# Patient Record
Sex: Female | Born: 1990 | Race: White | Hispanic: No | Marital: Single | Smoking: Never smoker
Health system: Southern US, Community
[De-identification: ages and names within clinical notes are randomized; demographics above are authoritative.]

## PROBLEM LIST (undated history)

## (undated) ENCOUNTER — Emergency Department (HOSPITAL_COMMUNITY): Payer: BC Managed Care – PPO

## (undated) DIAGNOSIS — F411 Generalized anxiety disorder: Secondary | ICD-10-CM

## (undated) HISTORY — DX: Generalized anxiety disorder: F41.1

## (undated) HISTORY — PX: NO PAST SURGERIES: SHX2092

---
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
## 2009-08-22 ENCOUNTER — Encounter: Admission: RE | Admit: 2009-08-22 | Discharge: 2009-08-22 | Payer: Self-pay | Admitting: Internal Medicine

## 2009-08-23 ENCOUNTER — Emergency Department (HOSPITAL_COMMUNITY): Admission: EM | Admit: 2009-08-23 | Discharge: 2009-08-23 | Payer: Self-pay | Admitting: Emergency Medicine

## 2009-11-17 ENCOUNTER — Emergency Department (HOSPITAL_COMMUNITY)
Admission: EM | Admit: 2009-11-17 | Discharge: 2009-11-17 | Disposition: A | Discharge status: Other Acute Inpatient Hospital | Payer: Self-pay | Source: Home / Self Care | Admitting: Emergency Medicine

## 2010-07-31 ENCOUNTER — Inpatient Hospital Stay (HOSPITAL_COMMUNITY)
Admission: RE | Admit: 2010-07-31 | Discharge: 2010-08-02 | Payer: Self-pay | Source: Home / Self Care | Attending: Psychiatry | Admitting: Psychiatry
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
## 2010-08-05 LAB — CBC
HCT: 39.2 % (ref 36.0–46.0)
Hemoglobin: 13 g/dL (ref 12.0–15.0)
MCH: 28.6 pg (ref 26.0–34.0)
MCHC: 33.2 g/dL (ref 30.0–36.0)
MCV: 86.3 fL (ref 78.0–100.0)
RDW: 13.8 % (ref 11.5–15.5)

## 2010-08-05 LAB — URINE DRUGS OF ABUSE SCREEN W ALC, ROUTINE (REF LAB)
Benzodiazepines.: NEGATIVE
Ethyl Alcohol: <10 mg/dL (ref <10)
Methadone: NEGATIVE
Opiate Screen, Urine: NEGATIVE
Phencyclidine (PCP): NEGATIVE
Propoxyphene: NEGATIVE

## 2010-08-05 LAB — COMPREHENSIVE METABOLIC PANEL
ALT: 15 U/L (ref 0–35)
AST: 17 U/L (ref 0–37)
CO2: 27 mEq/L (ref 19–32)
Creatinine, Ser: 0.93 mg/dL (ref 0.4–1.2)
Glucose, Bld: 98 mg/dL (ref 70–99)
Potassium: 3.6 mEq/L (ref 3.5–5.1)
Total Protein: 7.6 g/dL (ref 6.0–8.3)

## 2010-08-05 LAB — URINE MICROSCOPIC-ADD ON

## 2010-08-05 LAB — URINALYSIS, ROUTINE W REFLEX MICROSCOPIC
Leukocytes, UA: NEGATIVE
Nitrite: NEGATIVE
Urine Glucose, Fasting: NEGATIVE mg/dL
Urobilinogen, UA: 0.2 mg/dL (ref 0.0–1.0)
pH: 6 (ref 5.0–8.0)

## 2010-10-03 LAB — DIFFERENTIAL
Eosinophils Relative: 0 % (ref 0–5)
Lymphocytes Relative: 15 % (ref 12–46)
Lymphs Abs: 1.7 10*3/uL (ref 0.7–4.0)
Monocytes Absolute: 0.5 10*3/uL (ref 0.1–1.0)
Monocytes Relative: 5 % (ref 3–12)
Neutrophils Relative %: 80 % — ABNORMAL HIGH (ref 43–77)

## 2010-10-03 LAB — COMPREHENSIVE METABOLIC PANEL
ALT: 21 U/L (ref 0–35)
Albumin: 4.2 g/dL (ref 3.5–5.2)
Alkaline Phosphatase: 86 U/L (ref 39–117)
BUN: 8 mg/dL (ref 6–23)
GFR calc Af Amer: >60 mL/min (ref >60)
GFR calc non Af Amer: >60 mL/min (ref >60)
Glucose, Bld: 109 mg/dL — ABNORMAL HIGH (ref 70–99)
Potassium: 3.5 mEq/L (ref 3.5–5.1)
Total Bilirubin: 0.4 mg/dL (ref 0.3–1.2)

## 2010-10-03 LAB — URINALYSIS, ROUTINE W REFLEX MICROSCOPIC
Bilirubin Urine: NEGATIVE
Hgb urine dipstick: NEGATIVE
Ketones, ur: NEGATIVE mg/dL
Protein, ur: NEGATIVE mg/dL
Specific Gravity, Urine: 1.022 (ref 1.005–1.030)
Urobilinogen, UA: 1 mg/dL (ref 0.0–1.0)
pH: 7 (ref 5.0–8.0)

## 2010-10-03 LAB — CBC
HCT: 40.1 % (ref 36.0–46.0)
Hemoglobin: 13.4 g/dL (ref 12.0–15.0)
MCHC: 33.5 g/dL (ref 30.0–36.0)
Platelets: 311 10*3/uL (ref 150–400)
RBC: 4.74 MIL/uL (ref 3.87–5.11)

## 2010-10-03 LAB — PREGNANCY, URINE: Preg Test, Ur: NEGATIVE

## 2013-03-24 ENCOUNTER — Emergency Department (HOSPITAL_COMMUNITY)
Admission: EM | Admit: 2013-03-24 | Discharge: 2013-03-25 | Disposition: A | Discharge status: Home / Self Care | Payer: BC Managed Care – PPO | Attending: Emergency Medicine | Admitting: Emergency Medicine

## 2013-03-24 ENCOUNTER — Encounter (HOSPITAL_COMMUNITY): Payer: Self-pay | Admitting: Emergency Medicine

## 2013-03-24 DIAGNOSIS — S139XXA Sprain of joints and ligaments of unspecified parts of neck, initial encounter: Secondary | ICD-10-CM | POA: Insufficient documentation

## 2013-03-24 DIAGNOSIS — T148XXA Other injury of unspecified body region, initial encounter: Secondary | ICD-10-CM

## 2013-03-24 DIAGNOSIS — Y939 Activity, unspecified: Secondary | ICD-10-CM | POA: Insufficient documentation

## 2013-03-24 DIAGNOSIS — X58XXXA Exposure to other specified factors, initial encounter: Secondary | ICD-10-CM | POA: Insufficient documentation

## 2013-03-24 DIAGNOSIS — Y929 Unspecified place or not applicable: Secondary | ICD-10-CM | POA: Insufficient documentation

## 2013-03-24 DIAGNOSIS — R42 Dizziness and giddiness: Secondary | ICD-10-CM | POA: Insufficient documentation

## 2013-03-24 DIAGNOSIS — R51 Headache: Secondary | ICD-10-CM | POA: Insufficient documentation

## 2013-03-24 LAB — BASIC METABOLIC PANEL
CO2: 29 mEq/L (ref 19–32)
Calcium: 9.5 mg/dL (ref 8.4–10.5)
Chloride: 103 mEq/L (ref 96–112)
GFR calc Af Amer: >90 mL/min (ref >90)
Glucose, Bld: 103 mg/dL — ABNORMAL HIGH (ref 70–99)
Potassium: 3.8 mEq/L (ref 3.5–5.1)
Sodium: 138 mEq/L (ref 135–145)

## 2013-03-24 LAB — CBC
Hemoglobin: 12.9 g/dL (ref 12.0–15.0)
MCH: 28 pg (ref 26.0–34.0)
Platelets: 303 10*3/uL (ref 150–400)
RBC: 4.6 MIL/uL (ref 3.87–5.11)

## 2013-03-24 MED ORDER — METHOCARBAMOL 750 MG PO TABS: 750.0000 mg | ORAL_TABLET | Freq: Four times a day (QID) | ORAL | Status: DC

## 2013-03-24 MED ORDER — HYDROCODONE-ACETAMINOPHEN 5-325 MG PO TABS: 2.0000 | ORAL_TABLET | ORAL | Status: DC | PRN

## 2013-03-24 MED ORDER — IBUPROFEN 800 MG PO TABS: 800.0000 mg | ORAL_TABLET | Freq: Three times a day (TID) | ORAL | Status: DC

## 2013-03-24 NOTE — ED Notes (Signed)
Pt stated that for the past month she has had multiple episodes of neck pain at the base of her head with a HA as well, but today was the first time she also felt dizzy.  Pt reports nausea, blurred vision, and decreased appetite.

## 2013-03-24 NOTE — ED Provider Notes (Signed)
CSN: [id number]     Arrival date & time 03/24/13  1940 History   First MD Initiated Contact with Patient 03/24/13 2329     Chief Complaint  Patient presents with  . Neck Pain  . Dizziness   (Consider location/radiation/quality/duration/timing/severity/associated sxs/prior Treatment) Patient is a 22 y.o. female presenting with neck pain. The history is provided by the patient.  Neck Pain Pain location:  Occipital region Quality:  Burning Pain radiates to:  Does not radiate Pain severity:  Moderate Pain is:  Same all the time Onset quality:  Sudden Duration:  1 day Timing:  Constant Progression:  Worsening Chronicity:  New Context: not fall   Relieved by:  Nothing Worsened by:  Position Ineffective treatments:  Analgesics Associated symptoms: headaches   Associated symptoms: no chest pain, no photophobia, no syncope, no tingling and no visual change   pt notes a mild headache with assoc with the neck pain which starts at her upper back and also noted to be sharp--denies any trauma  History reviewed. No pertinent past medical history. History reviewed. No pertinent past surgical history. History reviewed. No pertinent family history. History  Substance Use Topics  . Smoking status: Never Smoker   . Smokeless tobacco: Never Used  . Alcohol Use: Yes     Comment: socially   OB History   Grav Para Term Preterm Abortions TAB SAB Ect Mult Living                 Review of Systems  HENT: Positive for neck pain.   Eyes: Negative for photophobia.  Cardiovascular: Negative for chest pain and syncope.  Neurological: Positive for headaches. Negative for tingling.  All other systems reviewed and are negative.    Allergies  Review of patient's allergies indicates no known allergies.  Home Medications   Current Outpatient Rx  Name  Route  Sig  Dispense  Refill  . ibuprofen (ADVIL,MOTRIN) 200 MG tablet   Oral   Take 400 mg by mouth every 6 (six) hours as needed for pain  (pain).          BP 134/71  Pulse 99  Temp(Src) 98.3 F (36.8 C) (Oral)  Resp 20  SpO2 100%  LMP 02/28/2013 Physical Exam  Nursing note and vitals reviewed. Constitutional: She is oriented to person, place, and time. She appears well-developed and well-nourished.  Non-toxic appearance. No distress.  HENT:  Head: Normocephalic and atraumatic.  Eyes: Conjunctivae, EOM and lids are normal. Pupils are equal, round, and reactive to light.  Neck: Normal range of motion. Neck supple. No tracheal deviation present. No mass present.  Cardiovascular: Normal rate, regular rhythm and normal heart sounds.  Exam reveals no gallop.   No murmur heard. Pulmonary/Chest: Effort normal and breath sounds normal. No stridor. No respiratory distress. She has no decreased breath sounds. She has no wheezes. She has no rhonchi. She has no rales.  Abdominal: Soft. Normal appearance and bowel sounds are normal. She exhibits no distension. There is no tenderness. There is no rebound and no CVA tenderness.  Musculoskeletal: Normal range of motion. She exhibits no edema and no tenderness.       Arms: Neurological: She is alert and oriented to person, place, and time. She has normal strength. No cranial nerve deficit or sensory deficit. GCS eye subscore is 4. GCS verbal subscore is 5. GCS motor subscore is 6.  Skin: Skin is warm and dry. No abrasion and no rash noted.  Psychiatric: She has a normal  mood and affect. Her speech is normal and behavior is normal.    ED Course  Procedures (including critical care time) Labs Review Labs Reviewed  CBC - Abnormal; Notable for the following:    WBC 11.9 (*)    All other components within normal limits  BASIC METABOLIC PANEL - Abnormal; Notable for the following:    Glucose, Bld 103 (*)    GFR calc non Af Amer 89 (*)    All other components within normal limits   Imaging Review No results found.  MDM  No diagnosis found. Pt with musculoskeletal pain , no  concern for neuroogical emergency, will tx with nsaids and muscle relaxants, return precautions given    Toy Baker, MD 03/24/13 2351

## 2013-06-02 ENCOUNTER — Other Ambulatory Visit: Payer: Self-pay | Admitting: Occupational Medicine

## 2013-06-02 ENCOUNTER — Ambulatory Visit: Payer: Self-pay

## 2013-06-02 DIAGNOSIS — R52 Pain, unspecified: Secondary | ICD-10-CM

## 2016-03-21 ENCOUNTER — Encounter (HOSPITAL_COMMUNITY): Payer: Self-pay | Admitting: Emergency Medicine

## 2016-03-21 ENCOUNTER — Emergency Department (HOSPITAL_COMMUNITY): Payer: BC Managed Care – PPO

## 2016-03-21 DIAGNOSIS — Y939 Activity, unspecified: Secondary | ICD-10-CM | POA: Insufficient documentation

## 2016-03-21 DIAGNOSIS — X58XXXA Exposure to other specified factors, initial encounter: Secondary | ICD-10-CM | POA: Diagnosis not present

## 2016-03-21 DIAGNOSIS — Y929 Unspecified place or not applicable: Secondary | ICD-10-CM | POA: Insufficient documentation

## 2016-03-21 DIAGNOSIS — Y999 Unspecified external cause status: Secondary | ICD-10-CM | POA: Diagnosis not present

## 2016-03-21 DIAGNOSIS — S93401A Sprain of unspecified ligament of right ankle, initial encounter: Secondary | ICD-10-CM | POA: Insufficient documentation

## 2016-03-21 DIAGNOSIS — Z791 Long term (current) use of non-steroidal anti-inflammatories (NSAID): Secondary | ICD-10-CM | POA: Diagnosis not present

## 2016-03-21 DIAGNOSIS — Z79899 Other long term (current) drug therapy: Secondary | ICD-10-CM | POA: Insufficient documentation

## 2016-03-21 DIAGNOSIS — S99911A Unspecified injury of right ankle, initial encounter: Secondary | ICD-10-CM | POA: Diagnosis present

## 2016-03-21 NOTE — ED Triage Notes (Signed)
Patient here with complaints of right ankle swelling 3 days ago. Unable to bear weight. Ibuprofen without relief.

## 2016-03-22 ENCOUNTER — Emergency Department (HOSPITAL_COMMUNITY)
Admission: EM | Admit: 2016-03-22 | Discharge: 2016-03-22 | Disposition: A | Discharge status: Home / Self Care | Payer: BC Managed Care – PPO | Attending: Emergency Medicine | Admitting: Emergency Medicine

## 2016-03-22 ENCOUNTER — Emergency Department (HOSPITAL_COMMUNITY): Payer: BC Managed Care – PPO

## 2016-03-22 DIAGNOSIS — S93401A Sprain of unspecified ligament of right ankle, initial encounter: Secondary | ICD-10-CM

## 2016-03-22 MED ORDER — NAPROXEN 500 MG PO TABS: 500.0000 mg | ORAL_TABLET | Freq: Two times a day (BID) | ORAL | 0 refills | Status: DC

## 2016-03-22 MED ORDER — HYDROCODONE-ACETAMINOPHEN 5-325 MG PO TABS: 1.0000 | ORAL_TABLET | Freq: Four times a day (QID) | ORAL | 0 refills | Status: DC | PRN

## 2016-03-22 NOTE — ED Provider Notes (Signed)
[facility]-EMERGENCY DEPT Provider Note   CSN: [id number] Arrival date & time: 03/21/16  2218  By signing my name below, I, Rosario AdieWilliam Andrew Hiatt, attest that this documentation has been prepared under the direction and in the presence of TRW AutomotiveKelly Ahyan Kreeger, PA-C.  Electronically Signed: Rosario AdieWilliam Andrew Hiatt, ED Scribe. 03/22/16. 1:47 AM.  History   Chief Complaint Chief Complaint  Patient presents with  . Ankle Pain   The history is provided by the patient. No language interpreter was used.   HPI Comments: Tina Blanchard is a 25 y.o. female who presents to the Emergency Department complaining of gradual onset, unchanged, constant right ankle swelling onset ~3 days ago. 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 No recent trauma or falls otherwise. Pt states that when she bears weight that it sends a shooting pain up the anterior aspect of her leg into her right knee. Pt reports that her pain to the area is exacerbated with bearing weight and palpation to the area. She has been taking Ibuprofen with minimal relief of her pain.  Pt is not on chronic steroids. Denies numbness.   History reviewed. No pertinent past medical history.  There are no active problems to display for this patient.  History reviewed. No pertinent surgical history.  OB History    No data available     Home Medications    Prior to Admission medications   Medication Sig Start Date End Date Taking? Authorizing Provider  HYDROcodone-acetaminophen (NORCO/VICODIN) 5-325 MG tablet Take 1-2 tablets by mouth every 6 (six) hours as needed for severe pain. 03/22/16   Antony MaduraKelly Jarae Panas, PA-C  ibuprofen (ADVIL,MOTRIN) 200 MG tablet Take 400 mg by mouth every 6 (six) hours as needed for pain (pain).    Historical Provider, MD  ibuprofen (ADVIL,MOTRIN) 800 MG tablet Take 1 tablet (800 mg total) by mouth 3 (three) times daily. 03/24/13   Lorre NickAnthony Allen, MD    methocarbamol (ROBAXIN-750) 750 MG tablet Take 1 tablet (750 mg total) by mouth 4 (four) times daily. 03/24/13   Lorre NickAnthony Allen, MD  naproxen (NAPROSYN) 500 MG tablet Take 1 tablet (500 mg total) by mouth 2 (two) times daily. 03/22/16   Antony MaduraKelly Subrena Devereux, PA-C   Family History No family history on file.  Social History Social History  Substance Use Topics  . Smoking status: Never Smoker  . Smokeless tobacco: Never Used  . Alcohol use Yes     Comment: socially   Allergies   Review of patient's allergies indicates no known allergies.  Review of Systems Review of Systems  Musculoskeletal: Positive for arthralgias (right ankle), joint swelling (right ankle) and myalgias.  Neurological: Negative for numbness.  A complete 10 system review of systems was obtained and all systems are negative except as noted in the HPI and PMH.    Physical Exam Updated Vital Signs BP 114/66 (BP Location: Left Arm)   Pulse 90   Temp 98.8 F (37.1 C) (Oral)   Resp 20   LMP 02/23/2016 (Exact Date)   SpO2 100%   Physical Exam  Constitutional: She is oriented to person, place, and time. She appears well-developed and well-nourished. No distress.  Nontoxic-appearing  HENT:  Head: Normocephalic and atraumatic.  Eyes: Conjunctivae and EOM are normal. No scleral icterus.  Neck: Normal range of motion.  Cardiovascular: Normal rate, regular rhythm and intact distal pulses.   DP and PT pulses 2+ in the  right lower extremity.  Pulmonary/Chest: Effort normal. No respiratory distress.  Musculoskeletal: Normal range of motion.       Right ankle: She exhibits swelling (minimal). She exhibits normal range of motion, no deformity and normal pulse. Tenderness. Achilles tendon normal.       Feet:  Neurological: She is alert and oriented to person, place, and time.  Patient able to wiggle all toes of the right foot. Patellar and Achilles reflexes intact in the right lower extremity. Sensation to light touch intact.   Skin: Skin is warm and dry. No rash noted. She is not diaphoretic. No erythema. No pallor.  Psychiatric: She has a normal mood and affect. Her behavior is normal.  Nursing note and vitals reviewed.   ED Treatments / Results  DIAGNOSTIC STUDIES: Oxygen Saturation is 100% on RA, normal by my interpretation.   COORDINATION OF CARE: 1:47 AM-Discussed next steps with pt. Pt verbalized understanding and is agreeable with the plan.   Labs (all labs ordered are listed, but only abnormal results are displayed) Labs Reviewed - No data to display  EKG  EKG Interpretation None      Radiology Dg Ankle Complete Right  Result Date: 03/22/2016 CLINICAL DATA:  Acute onset of right ankle swelling and inability to bear weight. Initial encounter. EXAM: RIGHT ANKLE - COMPLETE 3+ VIEW COMPARISON:  None. FINDINGS: There is no evidence of fracture or dislocation. The ankle mortise is intact; the interosseous space is within normal limits. No talar tilt or subluxation is seen. A plantar calcaneal spur is seen. The joint spaces are preserved. Mild medial soft tissue swelling is noted. IMPRESSION: No evidence of fracture or dislocation. Electronically Signed   By: Roanna Raider M.D.   On: 03/22/2016 02:24    Procedures Procedures (including critical care time)  Medications Ordered in ED Medications - No data to display  Initial Impression / Assessment and Plan / ED Course  I have reviewed the triage vital signs and the nursing notes.  Pertinent labs & imaging results that were available during my care of the patient were reviewed by me and considered in my medical decision making (see chart for details).  Clinical Course    25 year old female presents to the emergency department for evaluation of atraumatic right ankle pain. Range of motion preserved. No concern for septic joint or hemarthrosis. Patient neurovascularly intact. X-ray negative for fracture. Symptoms consistent with ankle sprain.  Patient managed with ASO ankle brace and crutches for WBAT. Will refer to orthopedics for further outpatient management. Return precautions discussed and provided. Patient discharged in stable condition with no unaddressed concerns.   Final Clinical Impressions(s) / ED Diagnoses   Final diagnoses:  Ankle sprain, right, initial encounter    New Prescriptions New Prescriptions   NAPROXEN (NAPROSYN) 500 MG TABLET    Take 1 tablet (500 mg total) by mouth 2 (two) times daily.    I personally performed the services described in this documentation, which was scribed in my presence. The recorded information has been reviewed and is accurate.       Antony Madura, PA-C 03/22/16 4098    Gilda Crease, MD 03/23/16 [phone]

## 2016-03-22 NOTE — ED Notes (Signed)
Pt in xray

## 2017-01-10 IMAGING — CR DG ANKLE COMPLETE 3+V*R*
3 series · 3 of 3 positions shown · non-contrast
Comparison: None.

CLINICAL DATA: Acute onset of right ankle swelling and inability to
bear weight. Initial encounter.

EXAM:
RIGHT ANKLE - COMPLETE 3+ VIEW

[x ankle ap right]
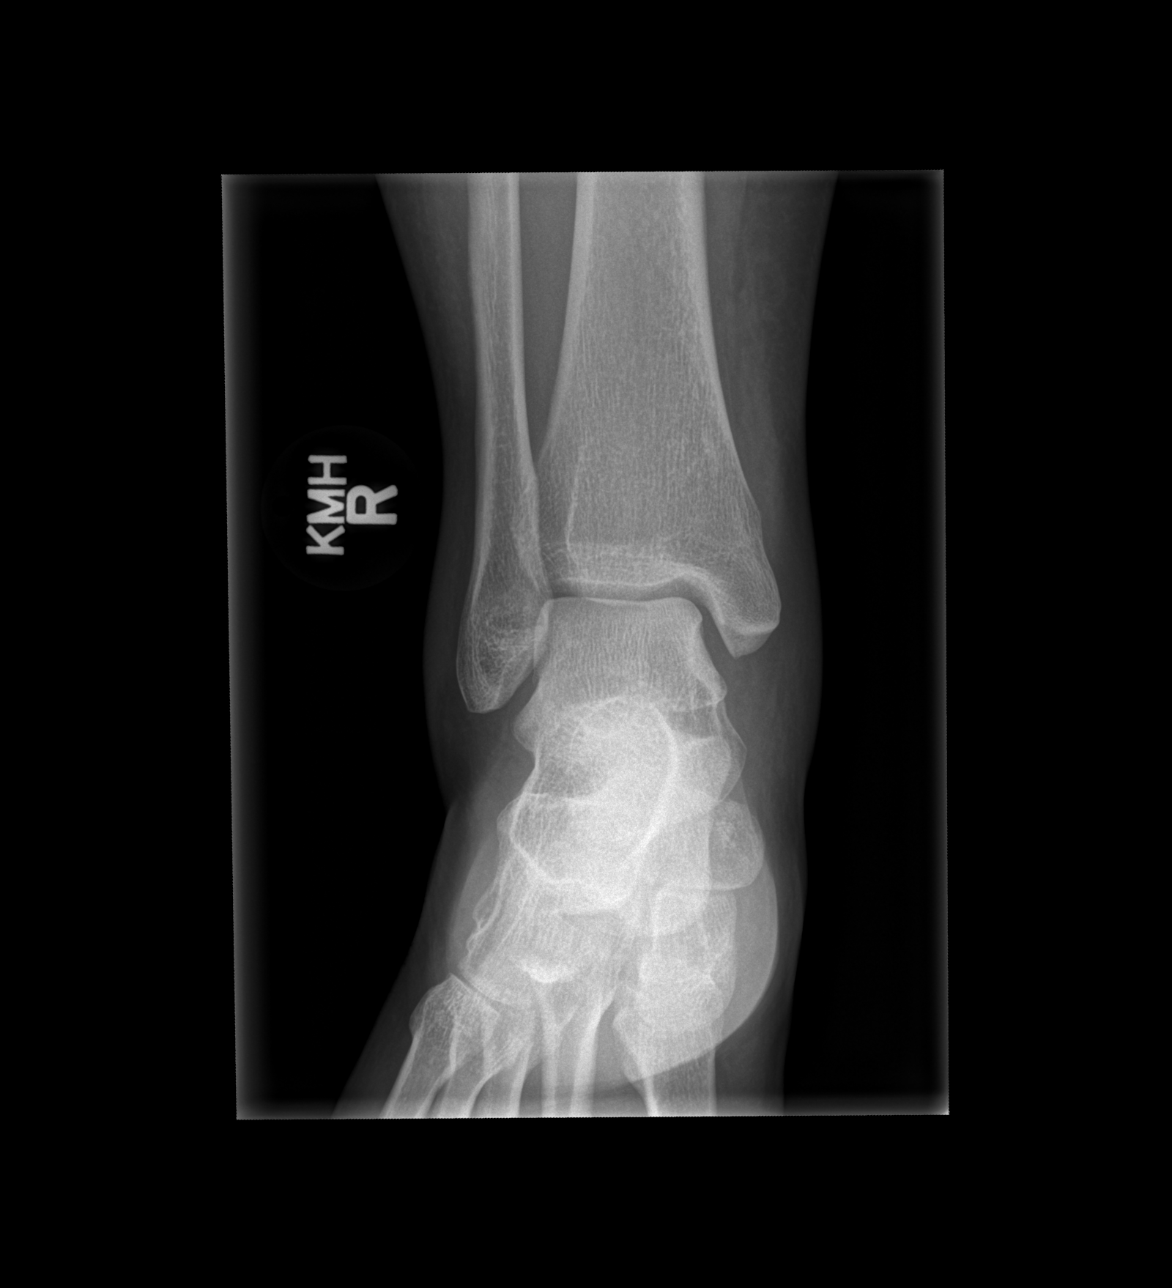

[x ankle obl right]
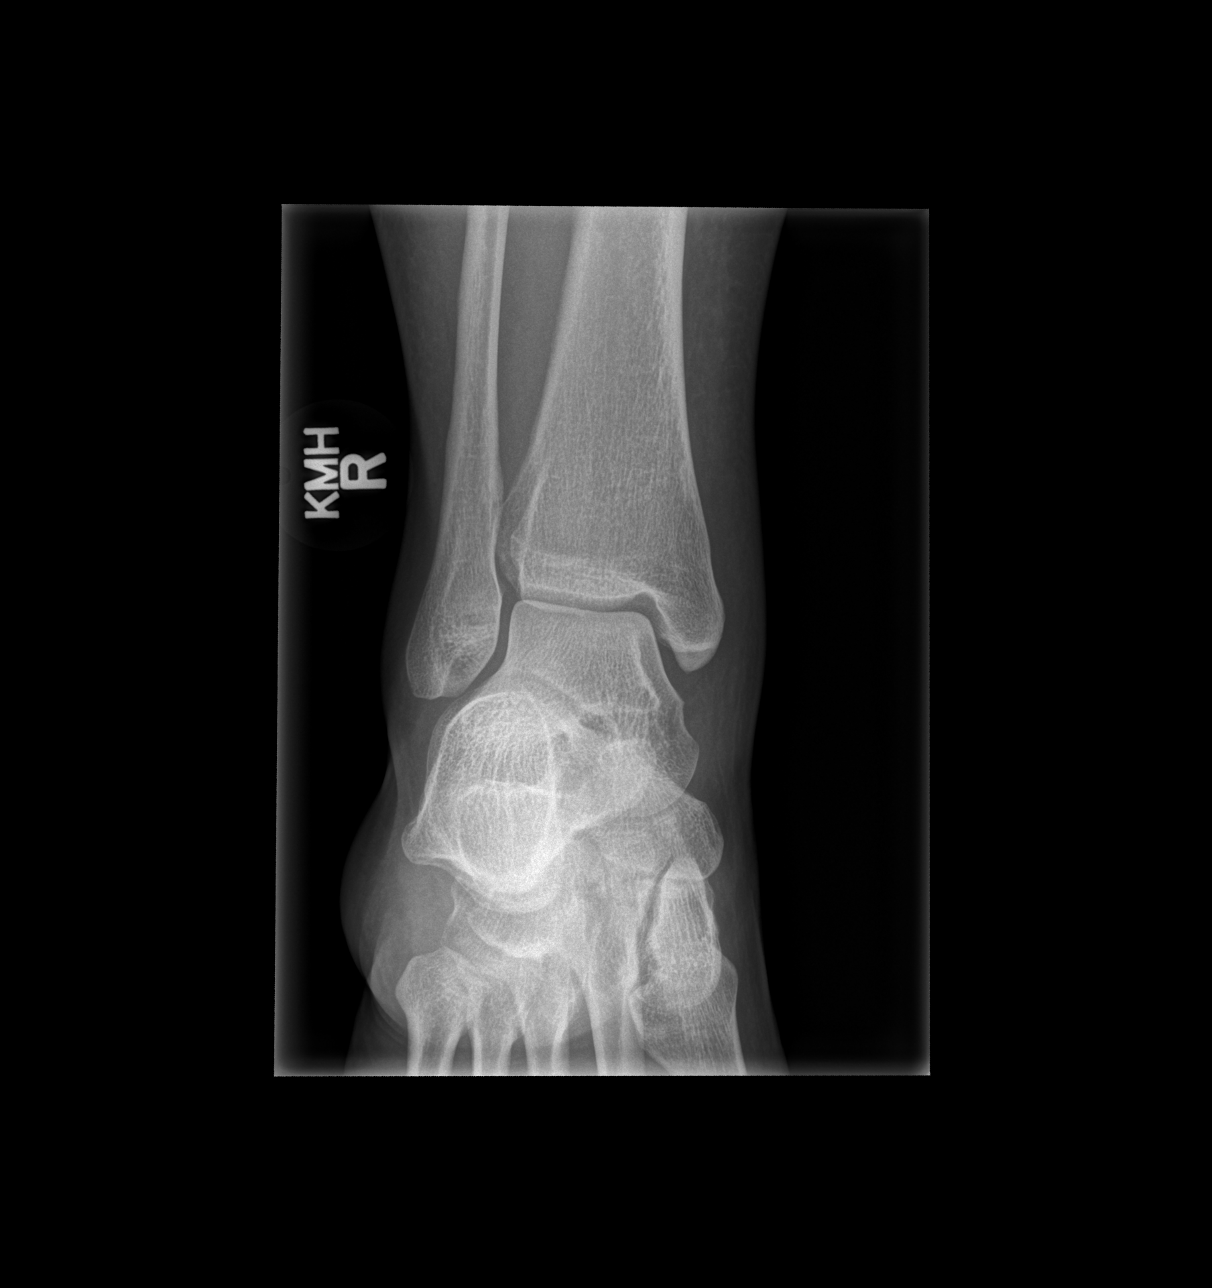

[x ankle lat right]
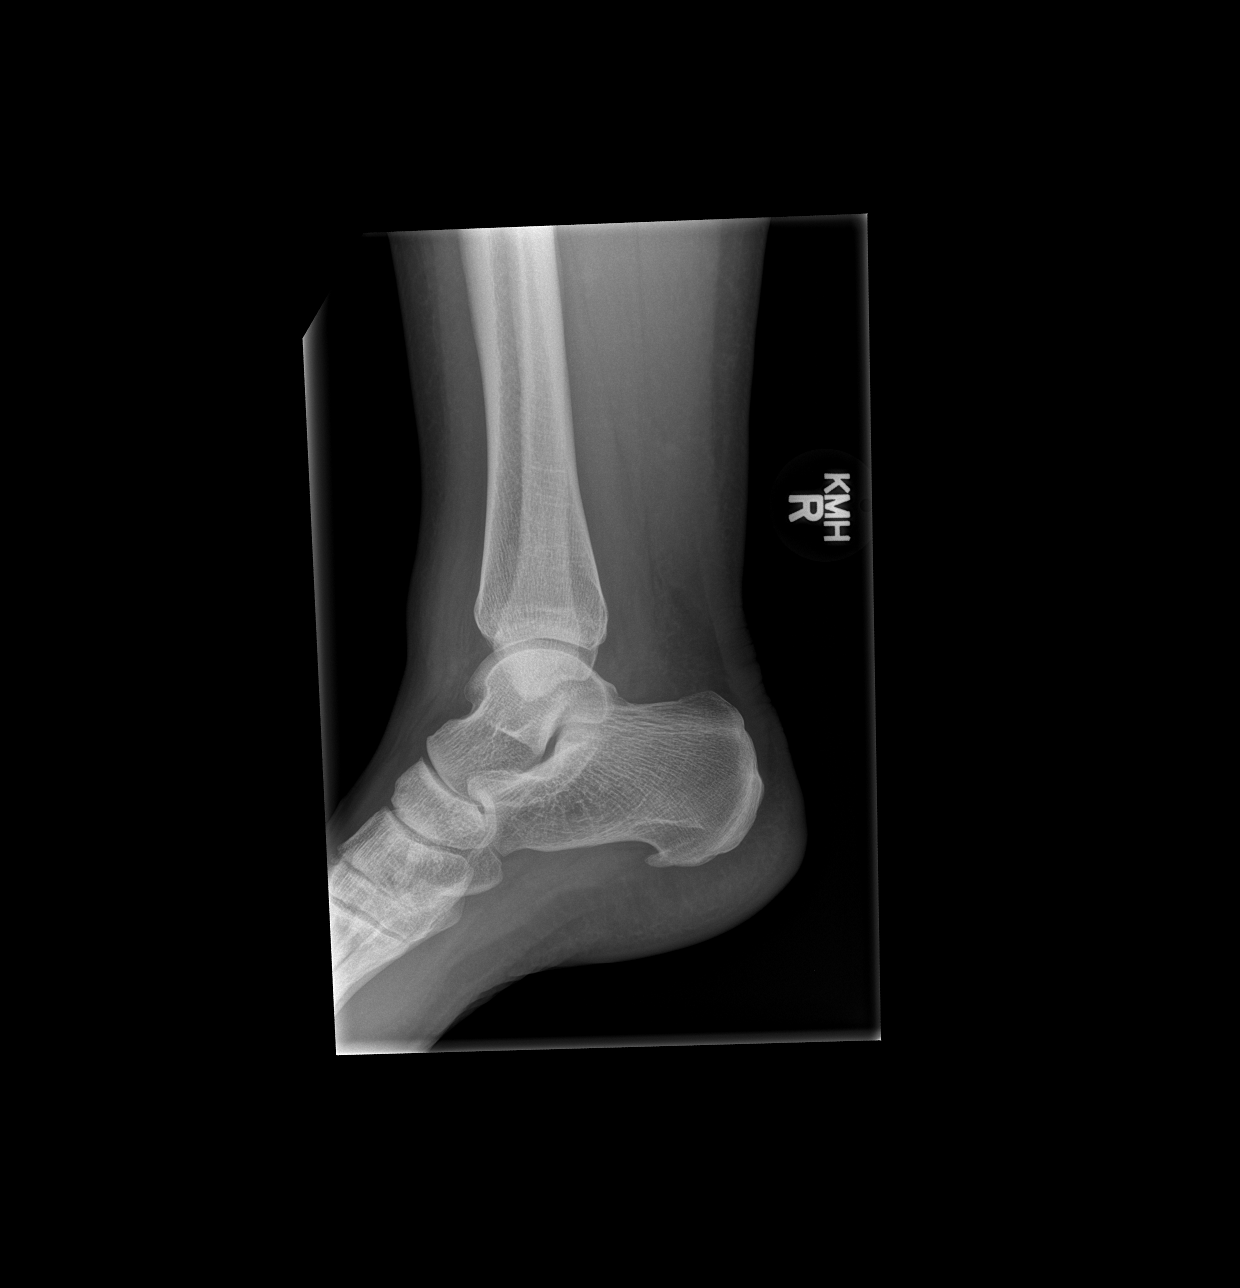

[3 of 3 positions shown; findings below may reference images not displayed]

FINDINGS: There is no evidence of fracture or dislocation. The ankle mortise
is intact; the interosseous space is within normal limits. No talar
tilt or subluxation is seen. A plantar calcaneal spur is seen.

The joint spaces are preserved. Mild medial soft tissue swelling is
noted.
IMPRESSION: No evidence of fracture or dislocation.

## 2018-06-15 ENCOUNTER — Encounter: Payer: Self-pay | Admitting: Emergency Medicine

## 2018-06-15 DIAGNOSIS — F329 Major depressive disorder, single episode, unspecified: Secondary | ICD-10-CM | POA: Insufficient documentation

## 2018-06-15 DIAGNOSIS — F411 Generalized anxiety disorder: Secondary | ICD-10-CM | POA: Insufficient documentation

## 2018-06-15 DIAGNOSIS — F4310 Post-traumatic stress disorder, unspecified: Secondary | ICD-10-CM | POA: Insufficient documentation

## 2018-06-15 DIAGNOSIS — F410 Panic disorder [episodic paroxysmal anxiety] without agoraphobia: Secondary | ICD-10-CM | POA: Insufficient documentation

## 2018-06-28 ENCOUNTER — Ambulatory Visit: Payer: Self-pay | Admitting: Psychiatry

## 2018-07-27 ENCOUNTER — Telehealth: Payer: Self-pay | Admitting: Psychiatry

## 2018-07-27 NOTE — Telephone Encounter (Signed)
Need to pull paper chart, not seen in epic

## 2018-07-27 NOTE — Telephone Encounter (Signed)
Pt called having left many msgs and at times not even gotten the machine when calling.  Had to [facility] apt in Dec and has RS for 09/08/18 Rx ran out of Zoloft (Sertraline)and now on day 12 w/o meds so essentially stopped cold [place].  Not good. Please call and advise.  Appt 09/08/18

## 2018-07-28 ENCOUNTER — Other Ambulatory Visit: Payer: Self-pay | Admitting: Psychiatry

## 2018-07-28 MED ORDER — SERTRALINE HCL 100 MG PO TABS: 150.0000 mg | ORAL_TABLET | Freq: Every day | ORAL | 0 refills | Status: DC

## 2018-07-28 NOTE — Telephone Encounter (Signed)
Have paper chart at desk, start back on sertraline? Was taking 100 mg 1.5 tabs daily

## 2018-09-05 ENCOUNTER — Telehealth: Payer: BC Managed Care – PPO | Admitting: Family

## 2018-09-05 DIAGNOSIS — R6889 Other general symptoms and signs: Secondary | ICD-10-CM

## 2018-09-05 MED ORDER — OSELTAMIVIR PHOSPHATE 75 MG PO CAPS: 75.0000 mg | ORAL_CAPSULE | Freq: Two times a day (BID) | ORAL | 0 refills | Status: DC

## 2018-09-05 MED ORDER — BENZONATATE 100 MG PO CAPS: 100.0000 mg | ORAL_CAPSULE | Freq: Three times a day (TID) | ORAL | 0 refills | Status: DC | PRN

## 2018-09-05 NOTE — Progress Notes (Signed)
Greater than 5 minutes, yet less than 10 minutes of time have been spent researching, coordinating, and implementing care for this patient today.  Thank you for the details you included in the comment boxes. Those details are very helpful in determining the best course of treatment for you and help [place] to provide the best care.  Given that you have major flu exposure risk, I am sending Tamiflu as below. We typically treat within 48h, yet new evidience shows there is still benefit after this time-frame. See below.  I have also sent Tessalon Perles 100mg , take 1-2 every 8 hours as needed for cough.   E visit for Flu like symptoms   We are sorry that you are not feeling well.  Here is how we plan to help! Based on what you have shared with me it looks like you may have possible exposure to a virus that causes influenza.  Influenza or "the flu" is   an infection caused by a respiratory virus. The flu virus is highly contagious and persons who did not receive their yearly flu vaccination may "catch" the flu from close contact.  We have anti-viral medications to treat the viruses that cause this infection. They are not a "cure" and only shorten the course of the infection. These prescriptions are most effective when they are given within the first 2 days of "flu" symptoms. Antiviral medication are indicated if you have a high risk of complications from the flu. You should  also consider an antiviral medication if you are in close contact with someone who is at risk. These medications can help patients avoid complications from the flu  but have side effects that you should know. Possible side effects from Tamiflu or oseltamivir include nausea, vomiting, diarrhea, dizziness, headaches, eye redness, sleep problems or other respiratory symptoms. You should not take Tamiflu if you have an allergy to oseltamivir or any to the ingredients in Tamiflu.  Based upon your symptoms and potential risk factors I have  prescribed Oseltamivir (Tamiflu).  It has been sent to your designated pharmacy.  You will take one 75 mg capsule orally twice a day for the next 5 days.  ANYONE WHO HAS FLU SYMPTOMS SHOULD: . Stay home. The flu is highly contagious and going out or to work exposes others! . Be sure to drink plenty of fluids. Water is fine as well as fruit juices, sodas and electrolyte beverages. You may want to stay away from caffeine or alcohol. If you are nauseated, try taking small sips of liquids. How do you know if you are getting enough fluid? Your urine should be a pale yellow or almost colorless. . Get rest. . Taking a steamy shower or using a humidifier may help nasal congestion and ease sore throat pain. Using a saline nasal spray works much the same way. . Cough drops, hard candies and sore throat lozenges may ease your cough. . Line up a caregiver. Have someone check on you regularly.   GET HELP RIGHT AWAY IF: . You cannot keep down liquids or your medications. . You become short of breath . Your fell like you are going to pass out or loose consciousness. . Your symptoms persist after you have completed your treatment plan MAKE SURE YOU   Understand these instructions.  Will watch your condition.  Will get help right away if you are not doing well or get worse.  Your e-visit answers were reviewed by a board certified advanced clinical practitioner to complete your  personal care plan.  Depending on the condition, your plan could have included both over the counter or prescription medications.  If there is a problem please reply  once you have received a response from your provider.  Your safety is important to [place].  If you have drug allergies check your prescription carefully.    You can use MyChart to ask questions about today's visit, request a non-urgent call back, or ask for a work or school excuse for 24 hours related to this e-Visit. If it has been greater than 24 hours you will need to  follow up with your provider, or enter a new e-Visit to address those concerns.  You will get an e-mail in the next two days asking about your experience.  I hope that your e-visit has been valuable and will speed your recovery. Thank you for using e-visits.

## 2018-09-08 ENCOUNTER — Ambulatory Visit (INDEPENDENT_AMBULATORY_CARE_PROVIDER_SITE_OTHER): Payer: BC Managed Care – PPO | Admitting: Psychiatry

## 2018-09-08 ENCOUNTER — Encounter: Payer: Self-pay | Admitting: Psychiatry

## 2018-09-08 DIAGNOSIS — F325 Major depressive disorder, single episode, in full remission: Secondary | ICD-10-CM | POA: Diagnosis not present

## 2018-09-08 DIAGNOSIS — F411 Generalized anxiety disorder: Secondary | ICD-10-CM | POA: Diagnosis not present

## 2018-09-08 DIAGNOSIS — F4310 Post-traumatic stress disorder, unspecified: Secondary | ICD-10-CM

## 2018-09-08 DIAGNOSIS — F4001 Agoraphobia with panic disorder: Secondary | ICD-10-CM

## 2018-09-08 MED ORDER — SERTRALINE HCL 100 MG PO TABS: 200.0000 mg | ORAL_TABLET | Freq: Every day | ORAL | 0 refills | Status: DC

## 2018-09-08 MED ORDER — LORAZEPAM 0.5 MG PO TABS: 0.5000 mg | ORAL_TABLET | Freq: Four times a day (QID) | ORAL | 1 refills | Status: DC | PRN

## 2018-09-08 NOTE — Progress Notes (Signed)
Tina Blanchard [id number] August 06, 1990 28 y.o.  Subjective:   Patient ID:  Tina Blanchard is a 28 y.o. (DOB 24-Sep-1990) female.  Chief Complaint:  Chief Complaint  Patient presents with  . Follow-up    Medication Management  . Anxiety  . Sleeping Problem   Last seen January 11, 2018 HPI Tina Blanchard presents to the office today for follow-up of anxiety and depression  At her last visit no medication changes were made.  Doing pretty good.  Depression seems under control except when forgot and ran out of meds at Christmas.  Anxiety still goes up and down.  Not sure if its stress related.  Uses tools from therapy.  Last time worse about 3 weeks ago, something little set it off with HR, dread, panicky without full panic, couldn't sit still, wanted to cry, overreactive.  Lasted burst about 15-20 mins but the adrenaline feeling lasted hours.  3-4 /month.  Sleep not great with hard time falling asleep and feels it's too light and awaken too much and taken OTC sleeper to calm herself.  Past Psychiatric Medication Trials: Lexapro, trazodone Citalopram, fluoxetine 60 with benefit, sertraline 150, lorazepam, temazepam, clonidine, Abilify 5 mg, Rexulti, trazodone   Review of Systems:  Review of Systems  HENT: Positive for congestion and voice change.   Neurological: Negative for tremors and weakness.  Psychiatric/Behavioral: Positive for sleep disturbance. Negative for agitation, behavioral problems, confusion, decreased concentration, dysphoric mood, hallucinations, self-injury and suicidal ideas. The patient is nervous/anxious. The patient is not hyperactive.     Medications: I have reviewed the patient's current medications.  Current Outpatient Medications  Medication Sig Dispense Refill  . LORazepam (ATIVAN) 0.5 MG tablet Take 1 tablet (0.5 mg total) by mouth every 6 (six) hours as needed for anxiety. 90 tablet 1  . oseltamivir (TAMIFLU) 75 MG capsule Take 1 capsule (75 mg  total) by mouth 2 (two) times daily. 10 capsule 0  . sertraline (ZOLOFT) 100 MG tablet Take 2 tablets (200 mg total) by mouth daily. 180 tablet 0  . benzonatate (TESSALON PERLES) 100 MG capsule Take 1-2 capsules (100-200 mg total) by mouth every 8 (eight) hours as needed for cough. (Patient not taking: Reported on 09/08/2018) 30 capsule 0   No current facility-administered medications for this visit.     Medication Side Effects: None  Allergies: No Known Allergies  History reviewed. No pertinent past medical history.  History reviewed. No pertinent family history.  Social History   Socioeconomic History  . Marital status: Single    Spouse name: Not on file  . Number of children: Not on file  . Years of education: Not on file  . Highest education level: Not on file  Occupational History  . Not on file  Social Needs  . Financial resource strain: Not on file  . Food insecurity:    Worry: Not on file    Inability: Not on file  . Transportation needs:    Medical: Not on file    Non-medical: Not on file  Tobacco Use  . Smoking status: Never Smoker  . Smokeless tobacco: Never Used  Substance and Sexual Activity  . Alcohol use: Yes    Comment: socially  . Drug use: No  . Sexual activity: Not on file  Lifestyle  . Physical activity:    Days per week: Not on file    Minutes per session: Not on file  . Stress: Not on file  Relationships  . Social connections:  Talks on phone: Not on file    Gets together: Not on file    Attends religious service: Not on file    Active member of club or organization: Not on file    Attends meetings of clubs or organizations: Not on file    Relationship status: Not on file  . Intimate partner violence:    Fear of current or ex partner: Not on file    Emotionally abused: Not on file    Physically abused: Not on file    Forced sexual activity: Not on file  Other Topics Concern  . Not on file  Social History Narrative  . Not on file     Past Medical History, Surgical history, Social history, and Family history were reviewed and updated as appropriate.   Please see review of systems for further details on the patient's review from today.   Objective:   Physical Exam:  There were no vitals taken for this visit.  Physical Exam Constitutional:      General: She is not in acute distress.    Appearance: She is well-developed.  Musculoskeletal:        General: No deformity.  Neurological:     Mental Status: She is alert and oriented to person, place, and time.     Coordination: Coordination normal.  Psychiatric:        Attention and Perception: Attention normal. She is attentive.        Mood and Affect: Mood is anxious. Mood is not depressed. Affect is not labile, blunt, angry or inappropriate.        Speech: Speech normal.        Behavior: Behavior normal.        Thought Content: Thought content normal. Thought content does not include homicidal or suicidal ideation. Thought content does not include homicidal or suicidal plan.        Cognition and Memory: Cognition normal.        Judgment: Judgment normal.     Comments: Insight is good.     Lab Review:     Component Value Date/Time   NA 138 03/24/2013 2020   K 3.8 03/24/2013 2020   CL 103 03/24/2013 2020   CO2 29 03/24/2013 2020   GLUCOSE 103 (H) 03/24/2013 2020   BUN 8 03/24/2013 2020   CREATININE 0.90 03/24/2013 2020   CALCIUM 9.5 03/24/2013 2020   PROT 7.6 08/01/2010 0620   ALBUMIN 3.8 08/01/2010 0620   AST 17 08/01/2010 0620   ALT 15 08/01/2010 0620   ALKPHOS 82 08/01/2010 0620   BILITOT 0.9 08/01/2010 0620   GFRNONAA 89 (L) 03/24/2013 2020   GFRAA >90 03/24/2013 2020       Component Value Date/Time   WBC 11.9 (H) 03/24/2013 2020   RBC 4.60 03/24/2013 2020   HGB 12.9 03/24/2013 2020   HCT 38.8 03/24/2013 2020   PLT 303 03/24/2013 2020   MCV 84.3 03/24/2013 2020   MCH 28.0 03/24/2013 2020   MCHC 33.2 03/24/2013 2020   RDW 13.2  03/24/2013 2020   LYMPHSABS 1.7 08/23/2009 1354   MONOABS 0.5 08/23/2009 1354   EOSABS 0.0 08/23/2009 1354   BASOSABS 0.0 08/23/2009 1354    No results found for: POCLITH, LITHIUM   No results found for: PHENYTOIN, PHENOBARB, VALPROATE, CBMZ   .res Assessment: Plan:    Panic disorder with agoraphobia  Generalized anxiety disorder  Major depression in complete remission (HCC)  PTSD (post-traumatic stress disorder)  Educated about limited  sx panic attacks which are ongoing.  He should be better controlled by the sertraline.  Discussed side effects.  Discussed the slow gradual nature of improvement related to increasing the sertraline and it will be 3 to 4 weeks before it is noticeable and is much as 8 weeks or more before maximum effect.  Therefore,  Increase sertraline to 200 mg daily.  Okay to use lorazepam as needed panic. We discussed the short-term risks associated with benzodiazepines including sedation and increased fall risk among others.  Discussed long-term side effect risk including dependence, potential withdrawal symptoms, and the potential eventual dose-related risk of dementia.  Use the lowest effective dose  Follow-up 3 months  Meredith Staggers, MD, DFAPA  Please see After Visit Summary for patient specific instructions.  No future appointments.  No orders of the defined types were placed in this encounter.     -------------------------------

## 2018-12-07 ENCOUNTER — Ambulatory Visit (INDEPENDENT_AMBULATORY_CARE_PROVIDER_SITE_OTHER): Payer: BC Managed Care – PPO | Admitting: Psychiatry

## 2018-12-07 ENCOUNTER — Ambulatory Visit: Payer: Self-pay | Admitting: Psychiatry

## 2018-12-07 ENCOUNTER — Encounter: Payer: Self-pay | Admitting: Psychiatry

## 2018-12-07 ENCOUNTER — Other Ambulatory Visit: Payer: Self-pay

## 2018-12-07 DIAGNOSIS — F325 Major depressive disorder, single episode, in full remission: Secondary | ICD-10-CM | POA: Diagnosis not present

## 2018-12-07 DIAGNOSIS — F411 Generalized anxiety disorder: Secondary | ICD-10-CM | POA: Diagnosis not present

## 2018-12-07 DIAGNOSIS — F4001 Agoraphobia with panic disorder: Secondary | ICD-10-CM

## 2018-12-07 DIAGNOSIS — F4310 Post-traumatic stress disorder, unspecified: Secondary | ICD-10-CM | POA: Diagnosis not present

## 2018-12-07 MED ORDER — PAROXETINE HCL 20 MG PO TABS: 40.0000 mg | ORAL_TABLET | Freq: Every day | ORAL | 1 refills | Status: DC

## 2018-12-07 NOTE — Progress Notes (Signed)
Tina Blanchard [id number] 1991-02-20 28 y.o.  Virtual Visit via Telephone Note  I connected with pt by telephone and verified that I am speaking with the correct person using two identifiers.   I discussed the limitations, risks, security and privacy concerns of performing an evaluation and management service by telephone and the availability of in person appointments. I also discussed with the patient that there may be a patient responsible charge related to this service. The patient expressed understanding and agreed to proceed.  I discussed the assessment and treatment plan with the patient. The patient was provided an opportunity to ask questions and all were answered. The patient agreed with the plan and demonstrated an understanding of the instructions.   The patient was advised to call back or seek an in-person evaluation if the symptoms worsen or if the condition fails to improve as anticipated.  I provided 20 minutes of non-face-to-face time during this encounter. The call started at 430 and ended at 450. The patient was located at home and the provider was located office.  Subjective:   Patient ID:  Tina Blanchard is a 28 y.o. (DOB 1991-02-20) female.  Chief Complaint:  Chief Complaint  Patient presents with  . Anxiety    med management    HPI Tina Blanchard presents to the office today for follow-up of anxiety and depression  Her last visit was September 08, 2018.  The sertraline was increased for limited symptom panic attacks that were still incurred during.  The dosage was increased to 200 mg daily.  Using more Ativan bc Covid has made her more stressed out and still having some panic and 0.5 mg doesn't help enough.  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  Before Covid thainks increase sertraline was helping.  No SE.  Was more calm.    Now panic a couple times per week and usually  triggered with work issues or family.  Ativan average use is when can't control panic but only taking 0.5 mg.  Panic is huge rush of adrenaline she can't shake it.  Can't sit still nor focus.  Took 1 mg Ativan with one large panic and it helped more.  Her last week of work is done next week until August.  Depression seems under control .  Sleep not great with hard time falling asleep and feels it's too light and awaken too much and taken OTC sleeper to calm herself.  Poor sleep 3 times weekly with variable initial and terminal insomnia.  Past Psychiatric Medication Trials: Lexapro, trazodone Citalopram, fluoxetine 60 with benefit, sertraline 150, lorazepam, temazepam, clonidine, Abilify 5 mg, Rexulti, trazodone   Review of Systems:  Review of Systems  HENT: Positive for congestion and voice change.   Neurological: Negative for tremors and weakness.  Psychiatric/Behavioral: Positive for sleep disturbance. Negative for agitation, behavioral problems, confusion, decreased concentration, dysphoric mood, hallucinations, self-injury and suicidal ideas. The patient is nervous/anxious. The patient is not hyperactive.     Medications: I have reviewed the patient's current medications.  Current Outpatient Medications  Medication Sig Dispense Refill  . LORazepam (ATIVAN) 0.5 MG tablet Take 1 tablet (0.5 mg total) by mouth every 6 (six) hours as needed for anxiety. 90 tablet 1  . sertraline (ZOLOFT) 100 MG tablet Take 2 tablets (200 mg total) by mouth daily. 180 tablet 0  . benzonatate (TESSALON PERLES) 100 MG capsule Take 1-2 capsules (100-200 mg total) by  mouth every 8 (eight) hours as needed for cough. (Patient not taking: Reported on 09/08/2018) 30 capsule 0  . oseltamivir (TAMIFLU) 75 MG capsule Take 1 capsule (75 mg total) by mouth 2 (two) times daily. (Patient not taking: Reported on 12/07/2018) 10 capsule 0   No current facility-administered medications for this visit.     Medication Side  Effects: None  Allergies: No Known Allergies  No past medical history on file.  No family history on file.  Social History   Socioeconomic History  . Marital status: Single    Spouse name: Not on file  . Number of children: Not on file  . Years of education: Not on file  . Highest education level: Not on file  Occupational History  . Not on file  Social Needs  . Financial resource strain: Not on file  . Food insecurity:    Worry: Not on file    Inability: Not on file  . Transportation needs:    Medical: Not on file    Non-medical: Not on file  Tobacco Use  . Smoking status: Never Smoker  . Smokeless tobacco: Never Used  Substance and Sexual Activity  . Alcohol use: Yes    Comment: socially  . Drug use: No  . Sexual activity: Not on file  Lifestyle  . Physical activity:    Days per week: Not on file    Minutes per session: Not on file  . Stress: Not on file  Relationships  . Social connections:    Talks on phone: Not on file    Gets together: Not on file    Attends religious service: Not on file    Active member of club or organization: Not on file    Attends meetings of clubs or organizations: Not on file    Relationship status: Not on file  . Intimate partner violence:    Fear of current or ex partner: Not on file    Emotionally abused: Not on file    Physically abused: Not on file    Forced sexual activity: Not on file  Other Topics Concern  . Not on file  Social History Narrative  . Not on file    Past Medical History, Surgical history, Social history, and Family history were reviewed and updated as appropriate.   Please see review of systems for further details on the patient's review from today.   Objective:   Physical Exam:  There were no vitals taken for this visit.  Physical Exam Neurological:     Mental Status: She is alert and oriented to person, place, and time.     Cranial Nerves: No dysarthria.  Psychiatric:        Attention and  Perception: Attention normal.        Mood and Affect: Mood is anxious. Mood is not depressed.        Speech: Speech normal.        Behavior: Behavior is cooperative.        Thought Content: Thought content normal. Thought content is not paranoid or delusional. Thought content does not include homicidal or suicidal ideation. Thought content does not include homicidal or suicidal plan.        Cognition and Memory: Cognition and memory normal.        Judgment: Judgment normal.     Comments: Insight fair.     Lab Review:     Component Value Date/Time   NA 138 03/24/2013 2020   K 3.8  03/24/2013 2020   CL 103 03/24/2013 2020   CO2 29 03/24/2013 2020   GLUCOSE 103 (H) 03/24/2013 2020   BUN 8 03/24/2013 2020   CREATININE 0.90 03/24/2013 2020   CALCIUM 9.5 03/24/2013 2020   PROT 7.6 08/01/2010 0620   ALBUMIN 3.8 08/01/2010 0620   AST 17 08/01/2010 0620   ALT 15 08/01/2010 0620   ALKPHOS 82 08/01/2010 0620   BILITOT 0.9 08/01/2010 0620   GFRNONAA 89 (L) 03/24/2013 2020   GFRAA >90 03/24/2013 2020       Component Value Date/Time   WBC 11.9 (H) 03/24/2013 2020   RBC 4.60 03/24/2013 2020   HGB 12.9 03/24/2013 2020   HCT 38.8 03/24/2013 2020   PLT 303 03/24/2013 2020   MCV 84.3 03/24/2013 2020   MCH 28.0 03/24/2013 2020   MCHC 33.2 03/24/2013 2020   RDW 13.2 03/24/2013 2020   LYMPHSABS 1.7 08/23/2009 1354   MONOABS 0.5 08/23/2009 1354   EOSABS 0.0 08/23/2009 1354   BASOSABS 0.0 08/23/2009 1354    No results found for: POCLITH, LITHIUM   No results found for: PHENYTOIN, PHENOBARB, VALPROATE, CBMZ   .res Assessment: Plan:    Panic disorder with agoraphobia  Generalized anxiety disorder  Major depression in complete remission (HCC)  PTSD (post-traumatic stress disorder)  Educated about limited sx panic attacks which are ongoing.  These e should be better controlled by the sertraline.  Discussed side effects. Discussed the possibility of switching to paroxetine 40 mg for  better panic control.  Disc SE each vs no change.  She wants to switch bc inadequate control of anxiety with sertraline.  Gave her at cross taper schedule with 1/2 tablet change every 5 days to get to the full 40 mg a paroxetine and off of sertraline.  Discussed SS RI withdrawal  Okay to use lorazepam as needed panic and increase to 1.0mg  prn panic. We discussed the short-term risks associated with benzodiazepines including sedation and increased fall risk among others.  Discussed long-term side effect risk including dependence, potential withdrawal symptoms, and the potential eventual dose-related risk of dementia.  Use the lowest effective dose as the anxiety improves she should need less of the lorazepam.  Follow-up 8 months  Meredith Staggers, MD, DFAPA  Please see After Visit Summary for patient specific instructions.  No future appointments.  No orders of the defined types were placed in this encounter.     -------------------------------

## 2019-02-08 ENCOUNTER — Ambulatory Visit: Payer: Self-pay | Admitting: Psychiatry

## 2019-02-20 ENCOUNTER — Other Ambulatory Visit: Payer: Self-pay | Admitting: Psychiatry

## 2019-02-20 DIAGNOSIS — F4001 Agoraphobia with panic disorder: Secondary | ICD-10-CM

## 2019-02-20 DIAGNOSIS — F411 Generalized anxiety disorder: Secondary | ICD-10-CM

## 2019-03-23 ENCOUNTER — Ambulatory Visit: Payer: Self-pay | Admitting: Psychiatry

## 2019-04-14 ENCOUNTER — Ambulatory Visit: Payer: BC Managed Care – PPO | Admitting: Psychiatry

## 2019-05-13 ENCOUNTER — Other Ambulatory Visit: Payer: Self-pay | Admitting: Psychiatry

## 2019-05-13 DIAGNOSIS — F4001 Agoraphobia with panic disorder: Secondary | ICD-10-CM

## 2019-05-13 DIAGNOSIS — F411 Generalized anxiety disorder: Secondary | ICD-10-CM

## 2019-06-06 ENCOUNTER — Other Ambulatory Visit: Payer: Self-pay

## 2019-06-06 ENCOUNTER — Encounter: Payer: Self-pay | Admitting: Psychiatry

## 2019-06-06 ENCOUNTER — Ambulatory Visit (INDEPENDENT_AMBULATORY_CARE_PROVIDER_SITE_OTHER): Payer: BC Managed Care – PPO | Admitting: Psychiatry

## 2019-06-06 DIAGNOSIS — F4001 Agoraphobia with panic disorder: Secondary | ICD-10-CM | POA: Diagnosis not present

## 2019-06-06 DIAGNOSIS — F325 Major depressive disorder, single episode, in full remission: Secondary | ICD-10-CM

## 2019-06-06 DIAGNOSIS — F411 Generalized anxiety disorder: Secondary | ICD-10-CM | POA: Diagnosis not present

## 2019-06-06 DIAGNOSIS — F5105 Insomnia due to other mental disorder: Secondary | ICD-10-CM

## 2019-06-06 MED ORDER — PAROXETINE HCL 40 MG PO TABS: 40.0000 mg | ORAL_TABLET | Freq: Every day | ORAL | 1 refills | Status: DC

## 2019-06-06 MED ORDER — LORAZEPAM 0.5 MG PO TABS: 0.5000 mg | ORAL_TABLET | Freq: Four times a day (QID) | ORAL | 3 refills | Status: DC | PRN

## 2019-06-06 NOTE — Progress Notes (Signed)
Tina Blanchard [id number] February 09, 1991 28 y.o.  Virtual Visit via Telephone Note  I connected with pt by telephone and verified that I am speaking with the correct person using two identifiers.   I discussed the limitations, risks, security and privacy concerns of performing an evaluation and management service by telephone and the availability of in person appointments. I also discussed with the patient that there may be a patient responsible charge related to this service. The patient expressed understanding and agreed to proceed.  I discussed the assessment and treatment plan with the patient. The patient was provided an opportunity to ask questions and all were answered. The patient agreed with the plan and demonstrated an understanding of the instructions.   The patient was advised to call back or seek an in-person evaluation if the symptoms worsen or if the condition fails to improve as anticipated.  I provided 20 minutes of non-face-to-face time during this encounter. The call started at 430 and ended at 450. The patient was located at home and the provider was located office.  Subjective:   Patient ID:  Tina Blanchard is a 28 y.o. (DOB 01-Oct-1990) female.  Chief Complaint:  Chief Complaint  Patient presents with  . Follow-up    Medication Management  . Anxiety    Medication Management  . Sleeping Problem    Anxiety Symptoms include nervous/anxious behavior. Patient reports no confusion, decreased concentration or suicidal ideas.    Depression        Associated symptoms include no decreased concentration and no suicidal ideas.  Past medical history includes anxiety.    Tina Blanchard presents to the office today for follow-up of anxiety and depression   September 08, 2018.  The sertraline was increased for limited symptom panic attacks that were still incurred during.  The dosage was increased to 200 mg daily.  Last seen May with poor control of panic on  Sertraline 200 mg daily, so recommended change to paroxetine 40 mg daily.  Seamless transition to Paxil.  Got Covid 1 month after the switch with a lot fatigue.  Fully recovered.  Has handled anxiety much better than with Zoloft.  Less panic.  1-2 /mo panic with even less during some months.  Overall much better and less severe. Tolerating it well.  Most panic at work pcpt by disorganization and set off more by Covid changes.    Using Ativan  1 every 2 weeks bc Covid has made her more stressed out and still having some panic and 0.5 mg does help.  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 No SE.  Was more calm.    Now panic a couple times per week and usually triggered with work issues or family.  Ativan average use is when can't control panic but only taking 0.5 mg.  Panic is huge rush of adrenaline she can't shake it.  Can't sit still nor focus.  Took 1 mg Ativan with one large panic and it helped more.  Her last week of work is done next week until August.  Depression seems under control .  Sleep not great with hard time falling asleep and feels it's too light and awaken too much and taken OTC sleeper to calm herself.  Poor sleep 3 times weekly with variable initial and terminal insomnia.  Mind races at nigh with worry and tasks needed to do.  4-5 hours sleep.  In bed 8 hours.  Past Psychiatric Medication Trials: Lexapro,  Citalopram, fluoxetine 60 with benefit, sertraline 200 partial resp,  Abilify 5 mg, Rexulti,  lorazepam, temazepam, clonidine, trazodone  Review of Systems:  Review of Systems  HENT: Negative for congestion and voice change.   Neurological: Negative for tremors and weakness.  Psychiatric/Behavioral: Positive for depression and sleep disturbance. Negative for agitation, behavioral problems, confusion, decreased concentration, dysphoric mood, hallucinations, self-injury and suicidal ideas. The patient is  nervous/anxious. The patient is not hyperactive.     Medications: I have reviewed the patient's current medications.  Current Outpatient Medications  Medication Sig Dispense Refill  . LORazepam (ATIVAN) 0.5 MG tablet Take 1 tablet (0.5 mg total) by mouth every 6 (six) hours as needed for anxiety or sleep. 90 tablet 3  . PARoxetine (PAXIL) 40 MG tablet Take 1 tablet (40 mg total) by mouth daily. 90 tablet 1   No current facility-administered medications for this visit.     Medication Side Effects: None  Allergies: No Known Allergies  History reviewed. No pertinent past medical history.  History reviewed. No pertinent family history.  Social History   Socioeconomic History  . Marital status: Single    Spouse name: Not on file  . Number of children: Not on file  . Years of education: Not on file  . Highest education level: Not on file  Occupational History  . Not on file  Social Needs  . Financial resource strain: Not on file  . Food insecurity    Worry: Not on file    Inability: Not on file  . Transportation needs    Medical: Not on file    Non-medical: Not on file  Tobacco Use  . Smoking status: Never Smoker  . Smokeless tobacco: Never Used  Substance and Sexual Activity  . Alcohol use: Yes    Comment: socially  . Drug use: No  . Sexual activity: Not on file  Lifestyle  . Physical activity    Days per week: Not on file    Minutes per session: Not on file  . Stress: Not on file  Relationships  . Social Musicianconnections    Talks on phone: Not on file    Gets together: Not on file    Attends religious service: Not on file    Active member of club or organization: Not on file    Attends meetings of clubs or organizations: Not on file    Relationship status: Not on file  . Intimate partner violence    Fear of current or ex partner: Not on file    Emotionally abused: Not on file    Physically abused: Not on file    Forced sexual activity: Not on file  Other Topics  Concern  . Not on file  Social History Narrative  . Not on file    Past Medical History, Surgical history, Social history, and Family history were reviewed and updated as appropriate.   Please see review of systems for further details on the patient's review from today.   Objective:   Physical Exam:  There were no vitals taken for this visit.  Physical Exam Neurological:     Mental Status: She is alert and oriented to person, place, and time.     Cranial Nerves: No dysarthria.  Psychiatric:        Attention and Perception: Attention normal.        Mood and Affect: Mood is anxious. Mood is not depressed.  Speech: Speech normal.        Behavior: Behavior is cooperative.        Thought Content: Thought content normal. Thought content is not paranoid or delusional. Thought content does not include homicidal or suicidal ideation. Thought content does not include homicidal or suicidal plan.        Cognition and Memory: Cognition and memory normal.        Judgment: Judgment normal.     Comments: Insight fair.     Lab Review:     Component Value Date/Time   NA 138 03/24/2013 2020   K 3.8 03/24/2013 2020   CL 103 03/24/2013 2020   CO2 29 03/24/2013 2020   GLUCOSE 103 (H) 03/24/2013 2020   BUN 8 03/24/2013 2020   CREATININE 0.90 03/24/2013 2020   CALCIUM 9.5 03/24/2013 2020   PROT 7.6 08/01/2010 0620   ALBUMIN 3.8 08/01/2010 0620   AST 17 08/01/2010 0620   ALT 15 08/01/2010 0620   ALKPHOS 82 08/01/2010 0620   BILITOT 0.9 08/01/2010 0620   GFRNONAA 89 (L) 03/24/2013 2020   GFRAA >90 03/24/2013 2020       Component Value Date/Time   WBC 11.9 (H) 03/24/2013 2020   RBC 4.60 03/24/2013 2020   HGB 12.9 03/24/2013 2020   HCT 38.8 03/24/2013 2020   PLT 303 03/24/2013 2020   MCV 84.3 03/24/2013 2020   MCH 28.0 03/24/2013 2020   MCHC 33.2 03/24/2013 2020   RDW 13.2 03/24/2013 2020   LYMPHSABS 1.7 08/23/2009 1354   MONOABS 0.5 08/23/2009 1354   EOSABS 0.0 08/23/2009  1354   BASOSABS 0.0 08/23/2009 1354    No results found for: POCLITH, LITHIUM   No results found for: PHENYTOIN, PHENOBARB, VALPROATE, CBMZ   .res Assessment: Plan:    Panic disorder with agoraphobia - Plan: LORazepam (ATIVAN) 0.5 MG tablet, PARoxetine (PAXIL) 40 MG tablet  Generalized anxiety disorder - Plan: PARoxetine (PAXIL) 40 MG tablet  Major depression in complete remission (HCC)  Insomnia due to mental condition - Plan: LORazepam (ATIVAN) 0.5 MG tablet  Educated about limited sx panic attacks which are much improved after switching to paroxetine 40 mg for better panic control.  The ones that she has now are triggered typically..  Discussed SSRI withdrawal  Okay to use lorazepam as needed panic and increase to 1.0mg  prn panic and 1 mg nightly for ongoing insomnia.. We discussed the short-term risks associated with benzodiazepines including sedation and increased fall risk among others.  Discussed long-term side effect risk including dependence, potential withdrawal symptoms, and the potential eventual dose-related risk of dementia.  Use the lowest effective dose as the anxiety improves she should need less of the lorazepam.  Disc pros/cons of taking meds for sleep versus not taking them.  She is failed other previous medications.  Her sleep quantity is not adequate and this probably contributes to other health related issues because of increasing stress hormones.  She agrees to increase the lorazepam.  Consider increasing paroxetine but I would suggest she have therapy before we do that.  The panic attack she now has now are triggered rather than spontaneous.  Rec Dr. Farrel Demark for CBT for panic.  She is having panic attacks which are triggered by specific circumstances.  I believe Dr. Farrel Demark can help her with this problem.  She agrees.  Follow-up 8 months  Meredith Staggers, MD, DFAPA  Please see After Visit Summary for patient specific instructions.  Future Appointments  Date  Time Provider  Department Center  09/07/2019  4:15 PM Cottle, [place]., MD [facility] None    No orders of the defined types were placed in this encounter.     -------------------------------

## 2019-07-13 ENCOUNTER — Ambulatory Visit: Payer: BC Managed Care – PPO | Admitting: Psychiatry

## 2019-07-17 ENCOUNTER — Telehealth: Payer: BC Managed Care – PPO | Admitting: Physician Assistant

## 2019-07-17 DIAGNOSIS — J069 Acute upper respiratory infection, unspecified: Secondary | ICD-10-CM | POA: Diagnosis not present

## 2019-07-17 MED ORDER — BENZONATATE 100 MG PO CAPS
100.0000 mg | ORAL_CAPSULE | Freq: Three times a day (TID) | ORAL | 0 refills | Status: DC | PRN
Start: 2019-07-17 — End: 2020-10-29

## 2019-07-17 MED ORDER — IPRATROPIUM BROMIDE 0.03 % NA SOLN: 2.0000 | Freq: Two times a day (BID) | NASAL | 0 refills | Status: DC

## 2019-07-17 NOTE — Progress Notes (Signed)
We are sorry you are not feeling well.  Here is how we plan to help!  Based on what you have shared with me, it looks like you may have a viral upper respiratory infection.  Upper respiratory infections are caused by a large number of viruses; however, rhinovirus is the most common cause.   Symptoms vary from person to person, with common symptoms including sore throat, cough, fatigue or lack of energy and feeling of general discomfort.  A low-grade fever of up to 100.4 may present, but is often uncommon.  Symptoms vary however, and are closely related to a person's age or underlying illnesses.  The most common symptoms associated with an upper respiratory infection are nasal discharge or congestion, cough, sneezing, headache and pressure in the ears and face.  These symptoms usually persist for about 3 to 10 days, but can last up to 2 weeks.  It is important to know that upper respiratory infections do not cause serious illness or complications in most cases.    Upper respiratory infections can be transmitted from person to person, with the most common method of transmission being a person's hands.  The virus is able to live on the skin and can infect other persons for up to 2 hours after direct contact.  Also, these can be transmitted when someone coughs or sneezes; thus, it is important to cover the mouth to reduce this risk.  To keep the spread of the illness at bay, good hand hygiene is very important.  This is an infection that is most likely caused by a virus. There are no specific treatments other than to help you with the symptoms until the infection runs its course.  We are sorry you are not feeling well.  Here is how we plan to help!   For nasal congestion, you may use an oral decongestants such as Mucinex D or if you have glaucoma or high blood pressure use plain Mucinex.  Saline nasal spray or nasal drops can help and can safely be used as often as needed for congestion.  For your congestion,  I have prescribed Ipratropium Bromide nasal spray 0.03% two sprays in each nostril 2-3 times a day  If you do not have a history of heart disease, hypertension, diabetes or thyroid disease, prostate/bladder issues or glaucoma, you may also use Sudafed to treat nasal congestion.  It is highly recommended that you consult with a pharmacist or your primary care physician to ensure this medication is safe for you to take.     If you have a cough, you may use cough suppressants such as Delsym and Robitussin.  If you have glaucoma or high blood pressure, you can also use Coricidin HBP.   For cough I have prescribed for you A prescription cough medication called Tessalon Perles 100 mg. You may take 1-2 capsules every 8 hours as needed for cough  If you have a sore or scratchy throat, use a saltwater gargle-  to  teaspoon of salt dissolved in a 4-ounce to 8-ounce glass of warm water.  Gargle the solution for approximately 15-30 seconds and then spit.  It is important not to swallow the solution.  You can also use throat lozenges/cough drops and Chloraseptic spray to help with throat pain or discomfort.  Warm or cold liquids can also be helpful in relieving throat pain.  For headache, pain or general discomfort, you can use Ibuprofen or Tylenol as directed.   Some authorities believe that zinc sprays or   the use of Echinacea may shorten the course of your symptoms.   HOME CARE . Only take medications as instructed by your medical team. . Be sure to drink plenty of fluids. Water is fine as well as fruit juices, sodas and electrolyte beverages. You may want to stay away from caffeine or alcohol. If you are nauseated, try taking small sips of liquids. How do you know if you are getting enough fluid? Your urine should be a pale yellow or almost colorless. . Get rest. . Taking a steamy shower or using a humidifier may help nasal congestion and ease sore throat pain. You can place a towel over your head and  breathe in the steam from hot water coming from a faucet. . Using a saline nasal spray works much the same way. . Cough drops, hard candies and sore throat lozenges may ease your cough. . Avoid close contacts especially the very young and the elderly . Cover your mouth if you cough or sneeze . Always remember to wash your hands.   GET HELP RIGHT AWAY IF: . You develop worsening fever. . If your symptoms do not improve within 10 days . You develop yellow or green discharge from your nose over 3 days. . You have coughing fits . You develop a severe head ache or visual changes. . You develop shortness of breath, difficulty breathing or start having chest pain . Your symptoms persist after you have completed your treatment plan  MAKE SURE YOU   Understand these instructions.  Will watch your condition.  Will get help right away if you are not doing well or get worse.  Your e-visit answers were reviewed by a board certified advanced clinical practitioner to complete your personal care plan. Depending upon the condition, your plan could have included both over the counter or prescription medications. Please review your pharmacy choice. If there is a problem, you may call our nursing hot line at and have the prescription routed to another pharmacy. Your safety is important to [place]. If you have drug allergies check your prescription carefully.   You can use MyChart to ask questions about today's visit, request a non-urgent call back, or ask for a work or school excuse for 24 hours related to this e-Visit. If it has been greater than 24 hours you will need to follow up with your provider, or enter a new e-Visit to address those concerns. You will get an e-mail in the next two days asking about your experience.  I hope that your e-visit has been valuable and will speed your recovery. Thank you for using e-visits.

## 2019-07-17 NOTE — Progress Notes (Signed)
I have spent 5 minutes in review of e-visit questionnaire, review and updating patient chart, medical decision making and response to patient.   Stan Cantave Cody Miyonna [place], PA-C    

## 2019-09-07 ENCOUNTER — Ambulatory Visit: Payer: BC Managed Care – PPO | Admitting: Psychiatry

## 2019-11-08 ENCOUNTER — Ambulatory Visit: Payer: BC Managed Care – PPO | Admitting: Psychiatry

## 2019-11-21 ENCOUNTER — Ambulatory Visit: Payer: BC Managed Care – PPO | Admitting: Psychiatry

## 2019-12-19 ENCOUNTER — Other Ambulatory Visit: Payer: Self-pay

## 2019-12-19 ENCOUNTER — Encounter: Payer: Self-pay | Admitting: Psychiatry

## 2019-12-19 ENCOUNTER — Ambulatory Visit (INDEPENDENT_AMBULATORY_CARE_PROVIDER_SITE_OTHER): Payer: BC Managed Care – PPO | Admitting: Psychiatry

## 2019-12-19 DIAGNOSIS — F4001 Agoraphobia with panic disorder: Secondary | ICD-10-CM

## 2019-12-19 DIAGNOSIS — F411 Generalized anxiety disorder: Secondary | ICD-10-CM | POA: Diagnosis not present

## 2019-12-19 DIAGNOSIS — F5105 Insomnia due to other mental disorder: Secondary | ICD-10-CM | POA: Diagnosis not present

## 2019-12-19 MED ORDER — PAROXETINE HCL 40 MG PO TABS: 40.0000 mg | ORAL_TABLET | Freq: Every day | ORAL | 1 refills | Status: DC

## 2019-12-19 MED ORDER — LORAZEPAM 1 MG PO TABS: 1.0000 mg | ORAL_TABLET | Freq: Three times a day (TID) | ORAL | 3 refills | Status: DC | PRN

## 2019-12-19 NOTE — Progress Notes (Signed)
Tina Blanchard [id number] [birth year] 29 y.o.   Subjective:   Patient ID:  Tina Blanchard is a 29 y.o. (DOB [birth year]) female.  Chief Complaint:  Chief Complaint  Patient presents with  . Follow-up  . Anxiety    Anxiety Symptoms include nervous/anxious behavior. Patient reports no confusion, decreased concentration or suicidal ideas.    Depression        Associated symptoms include fatigue.  Associated symptoms include no decreased concentration and no suicidal ideas.  Past medical history includes anxiety.    [name] presents to the office today for follow-up of anxiety and depression   September 08, 2018.  The sertraline was increased for limited symptom panic attacks that were still incurred during.  The dosage was increased to 200 mg daily.  seen May with poor control of panic on Sertraline 200 mg daily, so recommended change to paroxetine 40 mg daily.  November 2021 appointment with the following noted: Seamless transition to Paxil.  Got Covid 1 month after the switch with a lot fatigue.  Fully recovered.  Has handled anxiety much better than with Zoloft.  Less panic.  1-2 /mo panic with even less during some months.  Overall much better and less severe. Tolerating it well.  Most panic at work pcpt by disorganization and set off more by Covid changes.   Using Ativan  1 every 2 weeks bc Covid has made her more stressed out and still having some panic and 0.5 mg does help.  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 No SE.  Was more calm.   Now panic a couple times per week and usually triggered with work issues or family.  Ativan average use is when can't control panic but only taking 0.5 mg.  Panic is huge rush of adrenaline she can't shake it.  Can't sit still nor focus.  Took 1 mg Ativan with one large panic and it helped more.  Her last week of work is done next week until  August. Depression seems under control . Sleep not great with hard time falling asleep and feels it's too light and awaken too much and taken OTC sleeper to calm herself.  Poor sleep 3 times weekly with variable initial and terminal insomnia.  Mind races at nigh with worry and tasks needed to do.  4-5 hours sleep.  In bed 8 hours. Plan continue paroxetine 40 mg daily Okay to use lorazepam as needed panic and increase to 1.0mg  prn panic and 1 mg nightly for ongoing insomnia..   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  Cut way back on caffeine and eating  Better.  Can still get easily anxious but handles it better.  General anxiety reduced by at least 50%.   Did therapy on and off for 6-7 years and uses skills more effectively now.  Some fatigue with anemia being addressed. Tolerates meds.  Past Psychiatric Medication Trials: Lexapro,  Citalopram, fluoxetine 60 with benefit, sertraline 200 partial resp,  Abilify 5 mg, Rexulti,  lorazepam, temazepam, clonidine, trazodone  Review of Systems:  Review of Systems  Constitutional: Positive for fatigue.  HENT: Negative for congestion and voice change.   Neurological: Negative for tremors and weakness.  Psychiatric/Behavioral: Positive for depression. Negative for agitation, behavioral problems, confusion, decreased concentration, dysphoric mood, hallucinations, self-injury, sleep disturbance and suicidal  ideas. The patient is nervous/anxious. The patient is not hyperactive.     Medications: I have reviewed the patient's current medications.  Current Outpatient Medications  Medication Sig Dispense Refill  . benzonatate (TESSALON) 100 MG capsule Take 1 capsule (100 mg total) by mouth 3 (three) times daily as needed for cough. 30 capsule 0  . ipratropium (ATROVENT) 0.03 % nasal spray Place 2 sprays into both nostrils every 12 (twelve) hours. 30 mL 0   . LORazepam (ATIVAN) 1 MG tablet Take 1 tablet (1 mg total) by mouth every 8 (eight) hours as needed for anxiety or sleep. 60 tablet 3  . PARoxetine (PAXIL) 40 MG tablet Take 1 tablet (40 mg total) by mouth daily. 90 tablet 1   No current facility-administered medications for this visit.    Medication Side Effects: None  Allergies: No Known Allergies  History reviewed. No pertinent past medical history.  History reviewed. No pertinent family history.  Social History   Socioeconomic History  . Marital status: Single    Spouse name: Not on file  . Number of children: Not on file  . Years of education: Not on file  . Highest education level: Not on file  Occupational History  . Not on file  Tobacco Use  . Smoking status: Never Smoker  . Smokeless tobacco: Never Used  Substance and Sexual Activity  . Alcohol use: Yes    Comment: socially  . Drug use: No  . Sexual activity: Not on file  Other Topics Concern  . Not on file  Social History Narrative  . Not on file   Social Determinants of Health   Financial Resource Strain:   . Difficulty of Paying Living Expenses:   Food Insecurity:   . Worried About Charity fundraiser in the Last Year:   . Arboriculturist in the Last Year:   Transportation Needs:   . Film/video editor (Medical):   Marland Kitchen Lack of Transportation (Non-Medical):   Physical Activity:   . Days of Exercise per Week:   . Minutes of Exercise per Session:   Stress:   . Feeling of Stress :   Social Connections:   . Frequency of Communication with Friends and Family:   . Frequency of Social Gatherings with Friends and Family:   . Attends Religious Services:   . Active Member of Clubs or Organizations:   . Attends Archivist Meetings:   Marland Kitchen Marital Status:   Intimate Partner Violence:   . Fear of Current or Ex-Partner:   . Emotionally Abused:   Marland Kitchen Physically Abused:   . Sexually Abused:     Past Medical History, Surgical history, Social  history, and Family history were reviewed and updated as appropriate.   Please see review of systems for further details on the patient's review from today.   Objective:   Physical Exam:  There were no vitals taken for this visit.  Physical Exam Constitutional:      General: She is not in acute distress. Musculoskeletal:        General: No deformity.  Neurological:     Mental Status: She is alert and oriented to person, place, and time.     Cranial Nerves: No dysarthria.     Coordination: Coordination normal.  Psychiatric:        Attention and Perception: Attention and perception normal. She does not perceive auditory or visual hallucinations.        Mood and Affect: Mood is anxious.  Mood is not depressed. Affect is not labile, blunt, angry or inappropriate.        Speech: Speech normal. Speech is not slurred.        Behavior: Behavior normal. Behavior is cooperative.        Thought Content: Thought content normal. Thought content is not paranoid or delusional. Thought content does not include homicidal or suicidal ideation. Thought content does not include homicidal or suicidal plan.        Cognition and Memory: Cognition and memory normal.        Judgment: Judgment normal.     Comments: Insight fair.     Lab Review:     Component Value Date/Time   NA 138 03/24/2013 2020   K 3.8 03/24/2013 2020   CL 103 03/24/2013 2020   CO2 29 03/24/2013 2020   GLUCOSE 103 (H) 03/24/2013 2020   BUN 8 03/24/2013 2020   CREATININE 0.90 03/24/2013 2020   CALCIUM 9.5 03/24/2013 2020   PROT 7.6 08/01/2010 0620   ALBUMIN 3.8 08/01/2010 0620   AST 17 08/01/2010 0620   ALT 15 08/01/2010 0620   ALKPHOS 82 08/01/2010 0620   BILITOT 0.9 08/01/2010 0620   GFRNONAA 89 (L) 03/24/2013 2020   GFRAA >90 03/24/2013 2020       Component Value Date/Time   WBC 11.9 (H) 03/24/2013 2020   RBC 4.60 03/24/2013 2020   HGB 12.9 03/24/2013 2020   HCT 38.8 03/24/2013 2020   PLT 303 03/24/2013 2020    MCV 84.3 03/24/2013 2020   MCH 28.0 03/24/2013 2020   MCHC 33.2 03/24/2013 2020   RDW 13.2 03/24/2013 2020   LYMPHSABS 1.7 08/23/2009 1354   MONOABS 0.5 08/23/2009 1354   EOSABS 0.0 08/23/2009 1354   BASOSABS 0.0 08/23/2009 1354    No results found for: POCLITH, LITHIUM   No results found for: PHENYTOIN, PHENOBARB, VALPROATE, CBMZ   .res Assessment: Plan:    Panic disorder with agoraphobia - Plan: PARoxetine (PAXIL) 40 MG tablet, LORazepam (ATIVAN) 1 MG tablet  Generalized anxiety disorder - Plan: PARoxetine (PAXIL) 40 MG tablet  Insomnia due to mental condition - Plan: LORazepam (ATIVAN) 1 MG tablet  Educated about limited sx panic attacks which are much improved after switching to paroxetine 40 mg for better panic control.  She is not had a panic attack in 4 months.  Overall anxiety level is reduced by 50% and she satisfied with the med regimen..  Discussed SSRI withdrawal  Okay to use lorazepam as needed panic and increase to 1.0mg  prn panic and 1 mg nightly for ongoing insomnia.. We discussed the short-term risks associated with benzodiazepines including sedation and increased fall risk among others.  Discussed long-term side effect risk including dependence, potential withdrawal symptoms, and the potential eventual dose-related risk of dementia.  But recent studies from 2020 dispute this association between benzodiazepines and dementia risk. Newer studies in 2020 do not support an association with dementia.  Follow-up 6 months  Meredith Staggers, MD, DFAPA  Please see After Visit Summary for patient specific instructions.  No future appointments.  No orders of the defined types were placed in this encounter.     -------------------------------

## 2020-01-05 ENCOUNTER — Telehealth: Payer: Self-pay | Admitting: Psychiatry

## 2020-01-05 NOTE — Telephone Encounter (Signed)
Patient called and said that she is applying to be a foster parent and she needs a letter attention to [facility] stating that she is a current patient and that she is fit to be a foster parent mentally. She would like the letter to be faxed at [phone]. She is sending a release signed to [place] to send the letter. If you have any questions you can call her at [phone]

## 2020-01-11 ENCOUNTER — Other Ambulatory Visit: Payer: Self-pay | Admitting: Psychiatry

## 2020-01-11 DIAGNOSIS — F4001 Agoraphobia with panic disorder: Secondary | ICD-10-CM

## 2020-01-11 DIAGNOSIS — F5105 Insomnia due to other mental disorder: Secondary | ICD-10-CM

## 2020-01-12 ENCOUNTER — Other Ambulatory Visit: Payer: Self-pay | Admitting: Psychiatry

## 2020-01-12 DIAGNOSIS — F4001 Agoraphobia with panic disorder: Secondary | ICD-10-CM

## 2020-01-12 DIAGNOSIS — F5105 Insomnia due to other mental disorder: Secondary | ICD-10-CM

## 2020-01-30 ENCOUNTER — Telehealth: Payer: Self-pay | Admitting: Psychiatry

## 2020-01-30 NOTE — Telephone Encounter (Signed)
Tina Blanchard is calling to request status on the letter she is needing to be able to become a foster parent. Request received initially on June 24th.

## 2020-02-13 NOTE — Telephone Encounter (Signed)
Letter dictated

## 2020-06-20 ENCOUNTER — Encounter: Payer: Self-pay | Admitting: Psychiatry

## 2020-06-20 ENCOUNTER — Ambulatory Visit (INDEPENDENT_AMBULATORY_CARE_PROVIDER_SITE_OTHER): Payer: BC Managed Care – PPO | Admitting: Psychiatry

## 2020-06-20 ENCOUNTER — Other Ambulatory Visit: Payer: Self-pay

## 2020-06-20 DIAGNOSIS — F4310 Post-traumatic stress disorder, unspecified: Secondary | ICD-10-CM | POA: Diagnosis not present

## 2020-06-20 DIAGNOSIS — F411 Generalized anxiety disorder: Secondary | ICD-10-CM | POA: Diagnosis not present

## 2020-06-20 DIAGNOSIS — F5105 Insomnia due to other mental disorder: Secondary | ICD-10-CM

## 2020-06-20 DIAGNOSIS — F325 Major depressive disorder, single episode, in full remission: Secondary | ICD-10-CM

## 2020-06-20 DIAGNOSIS — F4001 Agoraphobia with panic disorder: Secondary | ICD-10-CM | POA: Diagnosis not present

## 2020-06-20 MED ORDER — LORAZEPAM 1 MG PO TABS: 1.0000 mg | ORAL_TABLET | Freq: Three times a day (TID) | ORAL | 3 refills | Status: DC | PRN

## 2020-06-20 MED ORDER — PAROXETINE HCL 40 MG PO TABS: 40.0000 mg | ORAL_TABLET | Freq: Every day | ORAL | 1 refills | Status: DC

## 2020-06-20 NOTE — Progress Notes (Signed)
Tina Blanchard [id number] 09-11-1990 29 y.o.   Subjective:   Patient ID:  Tina Blanchard is a 29 y.o. (DOB March 07, 1991) female.  Chief Complaint:  Chief Complaint  Patient presents with  . Follow-up    anxiety and depression    Anxiety Patient reports no confusion, decreased concentration, nervous/anxious behavior or suicidal ideas.    Depression        Associated symptoms include no decreased concentration, no fatigue and no suicidal ideas.  Past medical history includes anxiety.    Tina Blanchard presents to the office today for follow-up of anxiety and depression   September 08, 2018.  The sertraline was increased for limited symptom panic attacks that were still incurred during.  The dosage was increased to 200 mg daily.  seen May 2020 with poor control of panic on Sertraline 200 mg daily, so recommended change to paroxetine 40 mg daily.  November 2021 appointment with the following noted: Seamless transition to Paxil.  Got Covid 1 month after the switch with a lot fatigue.  Fully recovered.  Has handled anxiety much better than with Zoloft.  Less panic.  1-2 /mo panic with even less during some months.  Overall much better and less severe. Tolerating it well.  Most panic at work pcpt by disorganization and set off more by Covid changes.   Using Ativan  1 every 2 weeks bc Covid has made her more stressed out and still having some panic and 0.5 mg does help.  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 No SE.  Was more calm.   Now panic a couple times per week and usually triggered with work issues or family.  Ativan average use is when can't control panic but only taking 0.5 mg.  Panic is huge rush of adrenaline she can't shake it.  Can't sit still nor focus.  Took 1 mg Ativan with one large panic and it helped more.  Her last week of work is done next week until August. Depression seems under control  . Sleep not great with hard time falling asleep and feels it's too light and awaken too much and taken OTC sleeper to calm herself.  Poor sleep 3 times weekly with variable initial and terminal insomnia.  Mind races at nigh with worry and tasks needed to do.  4-5 hours sleep.  In bed 8 hours. Plan continue paroxetine 40 mg daily Okay to use lorazepam as needed panic and increase to 1.0mg  prn panic and 1 mg nightly for ongoing insomnia..   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  Cut way back on caffeine and eating  Better.  Can still get easily anxious but handles it better.  General anxiety reduced by at least 50%.   Did therapy on and off for 6-7 years and uses skills more effectively now.  Some fatigue with anemia being addressed. Tolerates meds. Plan no med changes  06/20/2020 appointment with following noted: Still on paroxetine 40 and prn lorazepam.  Good response. Has foster kids July to November 7 and 2yo.  Now just 29yo.   No panic attacks since here.  No depression. Average Ativan once wekly.   NO SE Patient reports stable mood and denies depressed or irritable moods.  Patient denies any recent difficulty with anxiety.  Patient denies difficulty with sleep initiation or  maintenance. Denies appetite disturbance.  Patient reports that energy and motivation have been good.  Patient denies any difficulty with concentration.  Patient denies any suicidal ideation.  Past Psychiatric Medication Trials: Lexapro,  Citalopram, fluoxetine 60 with benefit, sertraline 200 partial resp,   Paroxetine 40 good response Abilify 5 mg, Rexulti,  lorazepam, temazepam, clonidine, trazodone  Review of Systems:  Review of Systems  Constitutional: Negative for fatigue.  HENT: Negative for congestion and voice change.   Neurological: Negative for tremors and weakness.  Psychiatric/Behavioral: Positive  for depression. Negative for agitation, behavioral problems, confusion, decreased concentration, dysphoric mood, hallucinations, self-injury, sleep disturbance and suicidal ideas. The patient is not nervous/anxious and is not hyperactive.     Medications: I have reviewed the patient's current medications.  Current Outpatient Medications  Medication Sig Dispense Refill  . benzonatate (TESSALON) 100 MG capsule Take 1 capsule (100 mg total) by mouth 3 (three) times daily as needed for cough. 30 capsule 0  . ipratropium (ATROVENT) 0.03 % nasal spray Place 2 sprays into both nostrils every 12 (twelve) hours. 30 mL 0  . LORazepam (ATIVAN) 1 MG tablet Take 1 tablet (1 mg total) by mouth every 8 (eight) hours as needed for anxiety or sleep. 60 tablet 3  . PARoxetine (PAXIL) 40 MG tablet Take 1 tablet (40 mg total) by mouth daily. 90 tablet 1   No current facility-administered medications for this visit.    Medication Side Effects: None  Allergies: No Known Allergies  History reviewed. No pertinent past medical history.  History reviewed. No pertinent family history.  Social History   Socioeconomic History  . Marital status: Single    Spouse name: Not on file  . Number of children: Not on file  . Years of education: Not on file  . Highest education level: Not on file  Occupational History  . Not on file  Tobacco Use  . Smoking status: Never Smoker  . Smokeless tobacco: Never Used  Substance and Sexual Activity  . Alcohol use: Yes    Comment: socially  . Drug use: No  . Sexual activity: Not on file  Other Topics Concern  . Not on file  Social History Narrative  . Not on file   Social Determinants of Health   Financial Resource Strain:   . Difficulty of Paying Living Expenses: Not on file  Food Insecurity:   . Worried About Programme researcher, broadcasting/film/videounning Out of Food in the Last Year: Not on file  . Ran Out of Food in the Last Year: Not on file  Transportation Needs:   . Lack of Transportation  (Medical): Not on file  . Lack of Transportation (Non-Medical): Not on file  Physical Activity:   . Days of Exercise per Week: Not on file  . Minutes of Exercise per Session: Not on file  Stress:   . Feeling of Stress : Not on file  Social Connections:   . Frequency of Communication with Friends and Family: Not on file  . Frequency of Social Gatherings with Friends and Family: Not on file  . Attends Religious Services: Not on file  . Active Member of Clubs or Organizations: Not on file  . Attends BankerClub or Organization Meetings: Not on file  . Marital Status: Not on file  Intimate Partner Violence:   . Fear of Current or Ex-Partner: Not on file  . Emotionally Abused: Not on file  . Physically Abused: Not on file  . Sexually Abused: Not on file    Past  Medical History, Surgical history, Social history, and Family history were reviewed and updated as appropriate.   Please see review of systems for further details on the patient's review from today.   Objective:   Physical Exam:  There were no vitals taken for this visit.  Physical Exam Constitutional:      General: She is not in acute distress. Musculoskeletal:        General: No deformity.  Neurological:     Mental Status: She is alert and oriented to person, place, and time.     Cranial Nerves: No dysarthria.     Coordination: Coordination normal.  Psychiatric:        Attention and Perception: Attention and perception normal. She does not perceive auditory or visual hallucinations.        Mood and Affect: Mood is anxious. Mood is not depressed. Affect is not labile, blunt, angry or inappropriate.        Speech: Speech normal. Speech is not slurred.        Behavior: Behavior normal. Behavior is cooperative.        Thought Content: Thought content normal. Thought content is not paranoid or delusional. Thought content does not include homicidal or suicidal ideation. Thought content does not include homicidal or suicidal plan.         Cognition and Memory: Cognition and memory normal.        Judgment: Judgment normal.     Comments: Insight fair.     Lab Review:     Component Value Date/Time   NA 138 03/24/2013 2020   K 3.8 03/24/2013 2020   CL 103 03/24/2013 2020   CO2 29 03/24/2013 2020   GLUCOSE 103 (H) 03/24/2013 2020   BUN 8 03/24/2013 2020   CREATININE 0.90 03/24/2013 2020   CALCIUM 9.5 03/24/2013 2020   PROT 7.6 08/01/2010 0620   ALBUMIN 3.8 08/01/2010 0620   AST 17 08/01/2010 0620   ALT 15 08/01/2010 0620   ALKPHOS 82 08/01/2010 0620   BILITOT 0.9 08/01/2010 0620   GFRNONAA 89 (L) 03/24/2013 2020   GFRAA >90 03/24/2013 2020       Component Value Date/Time   WBC 11.9 (H) 03/24/2013 2020   RBC 4.60 03/24/2013 2020   HGB 12.9 03/24/2013 2020   HCT 38.8 03/24/2013 2020   PLT 303 03/24/2013 2020   MCV 84.3 03/24/2013 2020   MCH 28.0 03/24/2013 2020   MCHC 33.2 03/24/2013 2020   RDW 13.2 03/24/2013 2020   LYMPHSABS 1.7 08/23/2009 1354   MONOABS 0.5 08/23/2009 1354   EOSABS 0.0 08/23/2009 1354   BASOSABS 0.0 08/23/2009 1354    No results found for: POCLITH, LITHIUM   No results found for: PHENYTOIN, PHENOBARB, VALPROATE, CBMZ   .res Assessment: Plan:    Panic disorder with agoraphobia - Plan: PARoxetine (PAXIL) 40 MG tablet, LORazepam (ATIVAN) 1 MG tablet  Generalized anxiety disorder - Plan: PARoxetine (PAXIL) 40 MG tablet  Major depression in complete remission (HCC)  PTSD (post-traumatic stress disorder)  Insomnia due to mental condition - Plan: LORazepam (ATIVAN) 1 MG tablet  Educated about limited sx panic attacks which are much improved after switching to paroxetine 40 mg for better panic control.  She is not had a panic attack i since February 2021.   Overall anxiety level is reduced by 50% and she satisfied with the med regimen.Peri Jefferson response paroxetine 40.    Disc SE in detail and SSRI withdrawal sx.  Okay to use lorazepam as  needed panic and increase to 1.0mg  prn  panic and 1 mg nightly for ongoing insomnia.. We discussed the short-term risks associated with benzodiazepines including sedation and increased fall risk among others.  Discussed long-term side effect risk including dependence, potential withdrawal symptoms, and the potential eventual dose-related risk of dementia.  But recent studies from 2020 dispute this association between benzodiazepines and dementia risk. Newer studies in 2020 do not support an association with dementia.  Follow-up 6 months  Meredith Staggers, MD, DFAPA  Please see After Visit Summary for patient specific instructions.  No future appointments.  No orders of the defined types were placed in this encounter.     -------------------------------

## 2020-07-20 ENCOUNTER — Telehealth: Payer: BC Managed Care – PPO | Admitting: Physician Assistant

## 2020-07-20 DIAGNOSIS — J329 Chronic sinusitis, unspecified: Secondary | ICD-10-CM

## 2020-07-20 MED ORDER — AMOXICILLIN-POT CLAVULANATE 875-125 MG PO TABS: 1.0000 | ORAL_TABLET | Freq: Two times a day (BID) | ORAL | 0 refills | Status: AC

## 2020-07-20 NOTE — Progress Notes (Signed)
Thank you for the details you included in the comment boxes. Those details are very helpful in determining the best course of treatment for you and help [place] to provide the best care.  We are sorry that you are not feeling well.  Here is how we plan to help!  Based on what you have shared with me it looks like you have sinusitis.  Sinusitis is inflammation and infection in the sinus cavities of the head.  Based on your presentation I believe you most likely have Acute Bacterial Sinusitis.  This is an infection caused by bacteria and is treated with antibiotics. I have prescribed Augmentin 875mg /125mg  one tablet twice daily with food, for 7 days. You may use an oral decongestant such as Mucinex D or if you have glaucoma or high blood pressure use plain Mucinex. Saline nasal spray help and can safely be used as often as needed for congestion.  If you develop worsening sinus pain, fever or notice severe headache and vision changes, or if symptoms are not better after completion of antibiotic, please schedule an appointment with a health care provider.    Sinus infections are not as easily transmitted as other respiratory infection, however we still recommend that you avoid close contact with loved ones, especially the very young and elderly.  Remember to wash your hands thoroughly throughout the day as this is the number one way to prevent the spread of infection!  Home Care:  Only take medications as instructed by your medical team.  Complete the entire course of an antibiotic.  Do not take these medications with alcohol.  A steam or ultrasonic humidifier can help congestion.  You can place a towel over your head and breathe in the steam from hot water coming from a faucet.  Avoid close contacts especially the very young and the elderly.  Cover your mouth when you cough or sneeze.  Always remember to wash your hands.  Get Help Right Away If:  You develop worsening fever or sinus pain.  You  develop a severe head ache or visual changes.  Your symptoms persist after you have completed your treatment plan.  Make sure you  Understand these instructions.  Will watch your condition.  Will get help right away if you are not doing well or get worse.  Your e-visit answers were reviewed by a board certified advanced clinical practitioner to complete your personal care plan.  Depending on the condition, your plan could have included both over the counter or prescription medications.  If there is a problem please reply  once you have received a response from your provider.  Your safety is important to .  If you have drug allergies check your prescription carefully.    You can use MyChart to ask questions about today's visit, request a non-urgent call back, or ask for a work or school excuse for 24 hours related to this e-Visit. If it has been greater than 24 hours you will need to follow up with your provider, or enter a new e-Visit to address those concerns.  You will get an e-mail in the next two days asking about your experience.  I hope that your e-visit has been valuable and will speed your recovery. Thank you for using e-visits.   Greater than 5 minutes, yet less than 10 minutes of time have been spent researching, coordinating and implementing care for this patient today.

## 2020-10-29 ENCOUNTER — Encounter: Payer: Self-pay | Admitting: Family Medicine

## 2020-10-29 ENCOUNTER — Ambulatory Visit: Payer: BC Managed Care – PPO | Admitting: Family Medicine

## 2020-10-29 ENCOUNTER — Other Ambulatory Visit: Payer: Self-pay

## 2020-10-29 VITALS — BP 108/72 | HR 92 | Temp 98.0°F | Ht 66.0 in | Wt 272.0 lb

## 2020-10-29 DIAGNOSIS — Z23 Encounter for immunization: Secondary | ICD-10-CM

## 2020-10-29 DIAGNOSIS — Z114 Encounter for screening for human immunodeficiency virus [HIV]: Secondary | ICD-10-CM | POA: Diagnosis not present

## 2020-10-29 DIAGNOSIS — R41840 Attention and concentration deficit: Secondary | ICD-10-CM | POA: Diagnosis not present

## 2020-10-29 DIAGNOSIS — Z1159 Encounter for screening for other viral diseases: Secondary | ICD-10-CM

## 2020-10-29 DIAGNOSIS — Z0000 Encounter for general adult medical examination without abnormal findings: Secondary | ICD-10-CM | POA: Diagnosis not present

## 2020-10-29 NOTE — Progress Notes (Signed)
Chief Complaint  Patient presents with  . New Patient (Initial Visit)    Discuss ADHD     Well Woman ERMELINDA ECKERT is here for a complete physical.   Her last physical was last year.  Current diet: in general, a "pretty healthy" diet. Current exercise: walking. Fatigue out of ordinary? No Seatbelt? Yes  Inattention Pt w a couple decades of inattention and hyperactivity. No famhx. She has never had a formal dx. She sees psych who told her to f/u w pcp. Her anxiety is currently controlled on Ativan and Paxil through them.   Health Maintenance Pap/HPV- Yes Tetanus- No HIV screening- No Hep C screening- No  Past Medical History:  Diagnosis Date  . GAD (generalized anxiety disorder)      Past Surgical History:  Procedure Laterality Date  . NO PAST SURGERIES      Medications  Current Outpatient Medications on File Prior to Visit  Medication Sig Dispense Refill  . LORazepam (ATIVAN) 1 MG tablet Take 1 tablet (1 mg total) by mouth every 8 (eight) hours as needed for anxiety or sleep. 60 tablet 3  . PARoxetine (PAXIL) 40 MG tablet Take 1 tablet (40 mg total) by mouth daily. 90 tablet 1    Allergies No Known Allergies  Review of Systems: Constitutional:  no unexpected weight changes Eye:  no recent significant change in vision Ear/Nose/Mouth/Throat:  Ears:  no tinnitus or vertigo and no recent change in hearing Nose/Mouth/Throat:  no complaints of nasal congestion, no sore throat Cardiovascular: no chest pain Respiratory:  no cough and no shortness of breath Gastrointestinal:  no abdominal pain, no change in bowel habits GU:  Female: negative for dysuria or pelvic pain Musculoskeletal/Extremities:  no pain of the joints Integumentary (Skin/Breast):  no abnormal skin lesions reported Neurologic:  no headaches Endocrine:  denies fatigue Hematologic/Lymphatic:  No areas of easy bleeding  Exam BP 108/72 (BP Location: Left Arm, Patient Position: Sitting, Cuff Size:  Large)   Pulse 92   Temp 98 F (36.7 C) (Oral)   Ht 5\' 6"  (1.676 m)   Wt 272 lb (123.4 kg)   SpO2 98%   BMI 43.90 kg/m  General:  well developed, well nourished, in no apparent distress Skin:  no significant moles, warts, or growths Head:  no masses, lesions, or tenderness Eyes:  pupils equal and round, sclera anicteric without injection Ears:  canals without lesions, TMs shiny without retraction, no obvious effusion, no erythema Nose:  nares patent, septum midline, mucosa normal, and no drainage or sinus tenderness Throat/Pharynx:  lips and gingiva without lesion; tongue and uvula midline; non-inflamed pharynx; no exudates or postnasal drainage Neck: neck supple without adenopathy, thyromegaly, or masses Lungs:  clear to auscultation, breath sounds equal bilaterally, no respiratory distress Cardio:  regular rate and rhythm, no bruits, no LE edema Abdomen:  abdomen soft, nontender; bowel sounds normal; no masses or organomegaly Genital: Defer to GYN Musculoskeletal:  symmetrical muscle groups noted without atrophy or deformity Extremities:  no clubbing, cyanosis, or edema, no deformities, no skin discoloration Neuro:  gait normal; deep tendon reflexes normal and symmetric Psych: well oriented with normal range of affect and appropriate judgment/insight  Assessment and Plan  Well adult exam - Plan: Lipid panel, CBC, Comprehensive metabolic panel  Inattention - Plan: Ambulatory referral to Psychology  Encounter for hepatitis C screening test for low risk patient - Plan: Hepatitis C antibody  Encounter for screening for HIV - Plan: HIV Antibody (routine testing w rflx)  Need for Tdap vaccination - Plan: Tdap vaccine greater than or equal to 7yo IM   Well 30 y.o. female. Counseled on diet and exercise. Other orders as above. Refer for formal eval.  Follow up in 1 yr for CPE or pending results. . The patient voiced understanding and agreement to the plan.  Jilda Roche  [place], DO 10/29/20 3:12 PM

## 2020-10-31 ENCOUNTER — Other Ambulatory Visit: Payer: Self-pay

## 2020-10-31 ENCOUNTER — Other Ambulatory Visit (INDEPENDENT_AMBULATORY_CARE_PROVIDER_SITE_OTHER): Payer: BC Managed Care – PPO

## 2020-10-31 DIAGNOSIS — Z114 Encounter for screening for human immunodeficiency virus [HIV]: Secondary | ICD-10-CM

## 2020-10-31 DIAGNOSIS — Z1159 Encounter for screening for other viral diseases: Secondary | ICD-10-CM

## 2020-10-31 DIAGNOSIS — R799 Abnormal finding of blood chemistry, unspecified: Secondary | ICD-10-CM

## 2020-10-31 DIAGNOSIS — Z0000 Encounter for general adult medical examination without abnormal findings: Secondary | ICD-10-CM

## 2020-10-31 LAB — COMPREHENSIVE METABOLIC PANEL
ALT: 26 U/L (ref 0–35)
AST: 16 U/L (ref 0–37)
Albumin: 3.9 g/dL (ref 3.5–5.2)
Alkaline Phosphatase: 91 U/L (ref 39–117)
BUN: 12 mg/dL (ref 6–23)
CO2: 25 mEq/L (ref 19–32)
Calcium: 9.1 mg/dL (ref 8.4–10.5)
Chloride: 104 mEq/L (ref 96–112)
Creatinine, Ser: 0.95 mg/dL (ref 0.40–1.20)
GFR: 79.61 mL/min (ref >60.00)
Glucose, Bld: 105 mg/dL — ABNORMAL HIGH (ref 70–99)
Potassium: 4.5 mEq/L (ref 3.5–5.1)
Sodium: 137 mEq/L (ref 135–145)
Total Bilirubin: 0.4 mg/dL (ref 0.2–1.2)
Total Protein: 7.1 g/dL (ref 6.0–8.3)

## 2020-10-31 LAB — CBC
HCT: 40.1 % (ref 36.0–46.0)
Hemoglobin: 13.3 g/dL (ref 12.0–15.0)
MCHC: 33.2 g/dL (ref 30.0–36.0)
MCV: 80.7 fl (ref 78.0–100.0)
Platelets: 302 10*3/uL (ref 150.0–400.0)
RBC: 4.96 Mil/uL (ref 3.87–5.11)
RDW: 15.7 % — ABNORMAL HIGH (ref 11.5–15.5)
WBC: 10.1 10*3/uL (ref 4.0–10.5)

## 2020-10-31 LAB — LIPID PANEL
Cholesterol: 115 mg/dL (ref 0–200)
HDL: 46.8 mg/dL (ref >39.00)
LDL Cholesterol: 57 mg/dL (ref 0–99)
NonHDL: 68.26
Total CHOL/HDL Ratio: 2
Triglycerides: 54 mg/dL (ref 0.0–149.0)
VLDL: 10.8 mg/dL (ref 0.0–40.0)

## 2020-10-31 LAB — IBC + FERRITIN
Ferritin: 22.7 ng/mL (ref 10.0–291.0)
Iron: 34 ug/dL — ABNORMAL LOW (ref 42–145)
Saturation Ratios: 10.6 % — ABNORMAL LOW (ref 20.0–50.0)
Transferrin: 230 mg/dL (ref 212.0–360.0)

## 2020-10-31 NOTE — Addendum Note (Signed)
Addended by: Elita Boone E on: 10/31/2020 03:19 PM   Modules accepted: Orders

## 2020-10-31 NOTE — Addendum Note (Signed)
Addended by: Elita Boone E on: 10/31/2020 03:44 PM   Modules accepted: Orders

## 2020-10-31 NOTE — Addendum Note (Signed)
Addended by: Thelma Barge D on: 10/31/2020 08:22 AM   Modules accepted: Orders

## 2020-11-01 LAB — HEPATITIS C ANTIBODY
Hepatitis C Ab: NONREACTIVE
SIGNAL TO CUT-OFF: 0.01

## 2020-11-01 LAB — HIV ANTIBODY (ROUTINE TESTING W REFLEX): HIV 1&2 Ab, 4th Generation: NONREACTIVE

## 2020-11-13 ENCOUNTER — Telehealth: Payer: BC Managed Care – PPO | Admitting: Physician Assistant

## 2020-11-13 DIAGNOSIS — J019 Acute sinusitis, unspecified: Secondary | ICD-10-CM | POA: Diagnosis not present

## 2020-11-13 DIAGNOSIS — R058 Other specified cough: Secondary | ICD-10-CM

## 2020-11-13 MED ORDER — FLUTICASONE PROPIONATE 50 MCG/ACT NA SUSP: 2.0000 | Freq: Every day | NASAL | 0 refills | Status: DC

## 2020-11-13 MED ORDER — AMOXICILLIN-POT CLAVULANATE 875-125 MG PO TABS: 1.0000 | ORAL_TABLET | Freq: Two times a day (BID) | ORAL | 0 refills | Status: DC

## 2020-11-13 NOTE — Progress Notes (Signed)

## 2020-11-28 ENCOUNTER — Ambulatory Visit (INDEPENDENT_AMBULATORY_CARE_PROVIDER_SITE_OTHER): Payer: BC Managed Care – PPO | Admitting: Psychology

## 2020-11-28 DIAGNOSIS — F89 Unspecified disorder of psychological development: Secondary | ICD-10-CM

## 2020-12-21 ENCOUNTER — Ambulatory Visit: Payer: BC Managed Care – PPO | Admitting: Psychology

## 2020-12-25 ENCOUNTER — Telehealth (INDEPENDENT_AMBULATORY_CARE_PROVIDER_SITE_OTHER): Payer: BC Managed Care – PPO | Admitting: Psychiatry

## 2020-12-25 ENCOUNTER — Encounter: Payer: Self-pay | Admitting: Psychiatry

## 2020-12-25 DIAGNOSIS — F4001 Agoraphobia with panic disorder: Secondary | ICD-10-CM

## 2020-12-25 DIAGNOSIS — F325 Major depressive disorder, single episode, in full remission: Secondary | ICD-10-CM | POA: Diagnosis not present

## 2020-12-25 DIAGNOSIS — F5105 Insomnia due to other mental disorder: Secondary | ICD-10-CM

## 2020-12-25 DIAGNOSIS — F4310 Post-traumatic stress disorder, unspecified: Secondary | ICD-10-CM

## 2020-12-25 DIAGNOSIS — F411 Generalized anxiety disorder: Secondary | ICD-10-CM

## 2020-12-25 MED ORDER — LORAZEPAM 1 MG PO TABS: 1.0000 mg | ORAL_TABLET | Freq: Three times a day (TID) | ORAL | 3 refills | Status: DC | PRN

## 2020-12-25 MED ORDER — PAROXETINE HCL 40 MG PO TABS: 40.0000 mg | ORAL_TABLET | Freq: Every day | ORAL | 1 refills | Status: DC

## 2020-12-25 NOTE — Progress Notes (Signed)
Tina Blanchard [id number] 1991/04/22 30 y.o.  Video Visit via My Chart  I connected with pt by My Chart and verified that I am speaking with the correct person using two identifiers.   I discussed the limitations, risks, security and privacy concerns of performing an evaluation and management service by My Chart  and the availability of in person appointments. I also discussed with the patient that there may be a patient responsible charge related to this service. The patient expressed understanding and agreed to proceed.  I discussed the assessment and treatment plan with the patient. The patient was provided an opportunity to ask questions and all were answered. The patient agreed with the plan and demonstrated an understanding of the instructions.   The patient was advised to call back or seek an in-person evaluation if the symptoms worsen or if the condition fails to improve as anticipated.  I provided 30 minutes of video time during this encounter.  The patient was located at home and the provider was located office. Session started 900 until 930  Subjective:   Patient ID:  Tina Blanchard is a 30 y.o. (DOB 12-Jul-1991) female.  Chief Complaint:  Chief Complaint  Patient presents with   Follow-up   Panic disorder with agoraphobia   Sleeping Problem    Anxiety Patient reports no confusion, decreased concentration, nervous/anxious behavior, palpitations or suicidal ideas.    Depression        Associated symptoms include no decreased concentration, no fatigue and no suicidal ideas.  Past medical history includes anxiety.   [name] presents to the office today for follow-up of anxiety and depression   September 08, 2018.  The sertraline was increased for limited symptom panic attacks that were still incurred during.  The dosage was increased to 200 mg daily.  seen May 2020 with poor control of panic on Sertraline 200 mg daily, so recommended change to paroxetine  40 mg daily.  November 2021 appointment with the following noted: Seamless transition to Paxil.  Got Covid 1 month after the switch with a lot fatigue.  Fully recovered.  Has handled anxiety much better than with Zoloft.  Less panic.  1-2 /mo panic with even less during some months.  Overall much better and less severe. Tolerating it well.  Most panic at work pcpt by disorganization and set off more by Covid changes.   Using Ativan  1 every 2 weeks bc Covid has made her more stressed out and still having some panic and 0.5 mg does help.  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 No SE.  Was more calm.   Now panic a couple times per week and usually triggered with work issues or family.  Ativan average use is when can't control panic but only taking 0.5 mg.  Panic is huge rush of adrenaline she can't shake it.  Can't sit still nor focus.  Took 1 mg Ativan with one large panic and it helped more.  Her last week of work is done next week until August. Depression seems under control . Sleep not great with hard time falling asleep and feels it's too light and awaken too much and taken OTC sleeper to calm herself.  Poor sleep 3 times weekly with variable initial and terminal insomnia.  Mind races at nigh with worry and tasks needed to do.  4-5 hours sleep.  In bed 8 hours. Plan continue paroxetine 40  mg daily Okay to use lorazepam as needed panic and increase to 1.0mg  prn panic and 1 mg nightly for ongoing insomnia..   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  Cut way back on caffeine and eating  Better.  Can still get easily anxious but handles it better.  General anxiety reduced by at least 50%.   Did therapy on and off for 6-7 years and uses skills more effectively now.  Some fatigue with anemia being addressed. Tolerates  meds. Plan no med changes  06/20/2020 appointment with following noted: Still on paroxetine 40 and prn lorazepam.  Good response. Has foster kids July to November 7 and 2yo.  Now just 30yo.   No panic attacks since here.  No depression. Average Ativan once wekly.   NO SE Plan: Okay to use lorazepam as needed panic and increase to 1.0mg  prn panic and 1 mg nightly for ongoing insomnia..  12/25/20 appt needed: Lorazepam varies 0-1 mg HS and lately sleeping better.   Back to 1 foster child.  Sleep better. No panic but used lorazepam for pre-panic a couple of times since here.  O/W no panic. Still paroxetine 40 and no SE. [facility].  Had some neuropsych testing and pending results. She wanted to pursue bc at work hard time focusing on things and keeping things organized and affecting working performance.    Patient reports stable mood and denies depressed or irritable moods.  Patient denies any recent difficulty with anxiety which is overall pretty low.  Patient denies difficulty with sleep initiation or maintenance. Denies appetite disturbance.  Patient reports that energy and motivation have been good.  Patient denies any difficulty with concentration.  Patient denies any suicidal ideation.  Past Psychiatric Medication Trials: Lexapro,  Citalopram, fluoxetine 60 with benefit,  sertraline 200 partial resp,   Paroxetine 40 good response since 11/2018 Abilify 5 mg, Rexulti,  lorazepam, temazepam, clonidine, trazodone  Review of Systems:  Review of Systems  Constitutional:  Negative for fatigue.  HENT:  Negative for congestion and voice change.   Cardiovascular:  Negative for palpitations.  Neurological:  Negative for tremors and weakness.  Psychiatric/Behavioral:  Negative for agitation, behavioral problems, confusion, decreased concentration, dysphoric mood, hallucinations, self-injury, sleep disturbance and suicidal ideas. The patient is not  nervous/anxious and is not hyperactive.    Medications: I have reviewed the patient's current medications.  Current Outpatient Medications  Medication Sig Dispense Refill   LORazepam (ATIVAN) 1 MG tablet Take 1 tablet (1 mg total) by mouth every 8 (eight) hours as needed for anxiety or sleep. 60 tablet 3   PARoxetine (PAXIL) 40 MG tablet Take 1 tablet (40 mg total) by mouth daily. 90 tablet 1   amoxicillin-clavulanate (AUGMENTIN) 875-125 MG tablet Take 1 tablet by mouth 2 (two) times daily. One po bid x 7 days (Patient not taking: Reported on 12/25/2020) 14 tablet 0   fluticasone (FLONASE) 50 MCG/ACT nasal spray Place 2 sprays into both nostrils daily. (Patient not taking: Reported on 12/25/2020) 9.9 g 0   No current facility-administered medications for this visit.    Medication Side Effects: None  Allergies: No Known Allergies  Past Medical History:  Diagnosis Date   GAD (generalized anxiety disorder)     Family History  Problem Relation Age of Onset   Diabetes Mother    Hypertension Mother    Diabetes  Maternal Grandmother    Lung cancer Maternal Grandfather        Smoker   Uterine cancer Paternal Grandmother    Lung cancer Paternal Grandfather        smoker    Social History   Socioeconomic History   Marital status: Single    Spouse name: Not on file   Number of children: Not on file   Years of education: Not on file   Highest education level: Not on file  Occupational History   Not on file  Tobacco Use   Smoking status: Never   Smokeless tobacco: Never  Substance and Sexual Activity   Alcohol use: Yes    Comment: socially   Drug use: No   Sexual activity: Yes    Partners: Male  Other Topics Concern   Not on file  Social History Narrative   Not on file   Social Determinants of Health   Financial Resource Strain: Not on file  Food Insecurity: Not on file  Transportation Needs: Not on file  Physical Activity: Not on file  Stress: Not on file  Social  Connections: Not on file  Intimate Partner Violence: Not on file    Past Medical History, Surgical history, Social history, and Family history were reviewed and updated as appropriate.   Please see review of systems for further details on the patient's review from today.   Objective:   Physical Exam:  There were no vitals taken for this visit.  Physical Exam Constitutional:      General: She is not in acute distress. Musculoskeletal:        General: No deformity.  Neurological:     Mental Status: She is alert and oriented to person, place, and time.     Cranial Nerves: No dysarthria.     Coordination: Coordination normal.  Psychiatric:        Attention and Perception: Attention and perception normal. She does not perceive auditory or visual hallucinations.        Mood and Affect: Mood is anxious. Mood is not depressed. Affect is not labile, blunt, angry or inappropriate.        Speech: Speech normal. Speech is not slurred.        Behavior: Behavior normal. Behavior is cooperative.        Thought Content: Thought content normal. Thought content is not paranoid or delusional. Thought content does not include homicidal or suicidal ideation. Thought content does not include homicidal or suicidal plan.        Cognition and Memory: Cognition and memory normal.        Judgment: Judgment normal.     Comments: Insight fair. Anxiety manageable.    Lab Review:     Component Value Date/Time   NA 137 10/31/2020 0823   K 4.5 10/31/2020 0823   CL 104 10/31/2020 0823   CO2 25 10/31/2020 0823   GLUCOSE 105 (H) 10/31/2020 0823   BUN 12 10/31/2020 0823   CREATININE 0.95 10/31/2020 0823   CALCIUM 9.1 10/31/2020 0823   PROT 7.1 10/31/2020 0823   ALBUMIN 3.9 10/31/2020 0823   AST 16 10/31/2020 0823   ALT 26 10/31/2020 0823   ALKPHOS 91 10/31/2020 0823   BILITOT 0.4 10/31/2020 0823   GFRNONAA 89 (L) 03/24/2013 2020   GFRAA >90 03/24/2013 2020       Component Value Date/Time   WBC  10.1 10/31/2020 0823   RBC 4.96 10/31/2020 0823   HGB 13.3 10/31/2020 0823  HCT 40.1 10/31/2020 0823   PLT 302.0 10/31/2020 0823   MCV 80.7 10/31/2020 0823   MCH 28.0 03/24/2013 2020   MCHC 33.2 10/31/2020 0823   RDW 15.7 (H) 10/31/2020 0823   LYMPHSABS 1.7 08/23/2009 1354   MONOABS 0.5 08/23/2009 1354   EOSABS 0.0 08/23/2009 1354   BASOSABS 0.0 08/23/2009 1354    No results found for: POCLITH, LITHIUM   No results found for: PHENYTOIN, PHENOBARB, VALPROATE, CBMZ   .res Assessment: Plan:    Panic disorder with agoraphobia  Generalized anxiety disorder  Major depression in complete remission (HCC)  PTSD (post-traumatic stress disorder)  Insomnia due to mental condition  Educated about limited sx panic attacks which are much improved after switching to paroxetine 40 mg for better panic control.  She is not had a panic attack i since February 2021.   Overall anxiety level is reduced by 50% and she satisfied with the med regimen.Peri [place] response paroxetine 40.   Disc SE in detail and SSRI withdrawal sx.  Disc potential ADD dx and potential options.  Suggest get results to me.  Disc treatment options and risks SE.  Disc this at length including risks of higher anxiety. She''ll get [place] results of evaL  Okay to use lorazepam as needed panic and increase to 1.0mg  prn panic and 1 mg nightly for ongoing insomnia.. We discussed the short-term risks associated with benzodiazepines including sedation and increased fall risk among others.  Discussed long-term side effect risk including dependence, potential withdrawal symptoms, and the potential eventual dose-related risk of dementia.  But recent studies from 2020 dispute this association between benzodiazepines and dementia risk. Newer studies in 2020 do not support an association with dementia.  Follow-up 2-6 months  Meredith Staggers, MD, DFAPA  Please see After Visit Summary for patient specific instructions.  No future  appointments.  No orders of the defined types were placed in this encounter.     -------------------------------

## 2021-01-03 ENCOUNTER — Ambulatory Visit (INDEPENDENT_AMBULATORY_CARE_PROVIDER_SITE_OTHER): Payer: BC Managed Care – PPO | Admitting: Psychology

## 2021-01-03 DIAGNOSIS — F3342 Major depressive disorder, recurrent, in full remission: Secondary | ICD-10-CM

## 2021-01-03 DIAGNOSIS — F902 Attention-deficit hyperactivity disorder, combined type: Secondary | ICD-10-CM | POA: Diagnosis not present

## 2021-01-30 ENCOUNTER — Encounter: Payer: Self-pay | Admitting: Family Medicine

## 2021-01-30 ENCOUNTER — Ambulatory Visit: Payer: BC Managed Care – PPO | Admitting: Family Medicine

## 2021-01-30 ENCOUNTER — Other Ambulatory Visit: Payer: Self-pay

## 2021-01-30 VITALS — BP 110/76 | HR 86 | Temp 98.3°F | Resp 12 | Ht 66.0 in | Wt 267.4 lb

## 2021-01-30 DIAGNOSIS — F902 Attention-deficit hyperactivity disorder, combined type: Secondary | ICD-10-CM | POA: Diagnosis not present

## 2021-01-30 MED ORDER — AMPHETAMINE-DEXTROAMPHET ER 20 MG PO CP24: 20.0000 mg | ORAL_CAPSULE | Freq: Every day | ORAL | 0 refills | Status: DC

## 2021-01-30 NOTE — Progress Notes (Signed)
Chief Complaint  Patient presents with   ADHD    Subjective: Patient is a 30 y.o. female here for f/u inattention.  Saw psychology for a formal attention eval. She was dx'd w mild ADHD. She has never been on meds before. No famhx.   Past Medical History:  Diagnosis Date   GAD (generalized anxiety disorder)     Objective: BP 110/76 (BP Location: Left Arm, Cuff Size: Large)   Pulse 86   Temp 98.3 F (36.8 C) (Oral)   Resp 12   Ht 5\' 6"  (1.676 m)   Wt 267 lb 6.4 oz (121.3 kg)   SpO2 98%   BMI 43.16 kg/m  General: Awake, appears stated age Heart: RRR Lungs: CTAB, no rales, wheezes or rhonchi. No accessory muscle use Psych: Age appropriate judgment and insight, normal affect and mood  Assessment and Plan: Attention deficit hyperactivity disorder (ADHD), combined type, mild - Plan: amphetamine-dextroamphetamine (ADDERALL XR) 20 MG 24 hr capsule  New chronic issue, uncontrolled. Will trial Adderall. She does have a hx of GAD working w psych and s/s's are stable. She will let me know if anything changes. We will see her in 1 mo to reck.  The patient voiced understanding and agreement to the plan.  [place], DO 01/30/21  12:25 PM

## 2021-01-30 NOTE — Patient Instructions (Addendum)
There should be no cost issues with this.   Stay active and continue working with Dr. Jennelle Human.  Let us know if you need anything.

## 2021-02-06 ENCOUNTER — Telehealth: Payer: Self-pay

## 2021-02-06 NOTE — Telephone Encounter (Signed)
Submitted PA for Adderall today 02/06/21. [id number] Awaiting response

## 2021-02-06 NOTE — Telephone Encounter (Signed)
Patient called to informed she was prescribed adderall for the first time on 01-30-2021. She has not been able to start taking due to medication needing prior authorization.  Please initiate PA when you get a chance.

## 2021-02-08 NOTE — Telephone Encounter (Signed)
PA for Adderall has been approved. Called the patient to inform medication approved and to call her pharmacy to have them run it through again for her to pickup.

## 2021-03-01 ENCOUNTER — Encounter: Payer: Self-pay | Admitting: Family Medicine

## 2021-03-01 ENCOUNTER — Ambulatory Visit: Payer: BC Managed Care – PPO | Admitting: Family Medicine

## 2021-03-01 ENCOUNTER — Other Ambulatory Visit: Payer: Self-pay

## 2021-03-01 VITALS — BP 120/70 | HR 86 | Temp 98.0°F | Ht 65.0 in | Wt 262.0 lb

## 2021-03-01 DIAGNOSIS — F902 Attention-deficit hyperactivity disorder, combined type: Secondary | ICD-10-CM

## 2021-03-01 MED ORDER — AMPHETAMINE-DEXTROAMPHET ER 15 MG PO CP24
15.0000 mg | ORAL_CAPSULE | ORAL | 0 refills | Status: DC
Start: 2021-03-01 — End: 2021-04-05

## 2021-03-01 NOTE — Progress Notes (Signed)
Chief Complaint  Patient presents with   Follow-up    Medication     Tina Blanchard is 30 y.o. female here for ADHD follow up.  Patient is currently on Adderall XR 20 mg/d and compliance is excellent. Symptoms are well controlled.  Side effects include: insomnia if she takes too late (0600 is OK, 0800 will affect her 10 PM bedtime), jitteriness Patient believes their dose should be decreased.   Past Medical History:  Diagnosis Date   GAD (generalized anxiety disorder)     BP 120/70   Pulse 86   Temp 98 F (36.7 C) (Oral)   Ht 5\' 5"  (1.651 m)   Wt 262 lb (118.8 kg)   SpO2 98%   BMI 43.60 kg/m  Gen- awake, alert, appearing stated age Lungs- no accessory muscle use Neuro- no facial tics Psych- age appropriate judgment and insight, normal mood and affect  Attention deficit hyperactivity disorder (ADHD), combined type, mild - Plan: amphetamine-dextroamphetamine (ADDERALL XR) 15 MG 24 hr capsule  Chronic, AE from medication. Decrease from 20 mg/d to 15 mg/d.  F/u in 1 mo. Pt voiced understanding and agreement to the plan.  [place], DO 03/01/21 3:31 PM

## 2021-03-01 NOTE — Patient Instructions (Signed)
Let me know if there are any issues.   Good luck with the kids.  Let us know if you need anything.

## 2021-03-04 ENCOUNTER — Telehealth: Payer: Self-pay | Admitting: Family Medicine

## 2021-03-04 NOTE — Telephone Encounter (Signed)
Initiated PA for D-Amphetamine ER 15 MG Capsule [id number]. Completed on Covermymeds. Waiting decision

## 2021-03-05 NOTE — Telephone Encounter (Signed)
PA approved from 03/05/2021 ---  03/05/2024. Pharmacy/patient aware of approval.

## 2021-04-05 ENCOUNTER — Other Ambulatory Visit: Payer: Self-pay

## 2021-04-05 ENCOUNTER — Encounter: Payer: Self-pay | Admitting: Family Medicine

## 2021-04-05 ENCOUNTER — Ambulatory Visit: Payer: BC Managed Care – PPO | Admitting: Family Medicine

## 2021-04-05 VITALS — BP 114/61 | HR 93 | Temp 97.5°F | Ht 66.0 in | Wt 255.4 lb

## 2021-04-05 DIAGNOSIS — F902 Attention-deficit hyperactivity disorder, combined type: Secondary | ICD-10-CM | POA: Diagnosis not present

## 2021-04-05 MED ORDER — AMPHETAMINE-DEXTROAMPHET ER 20 MG PO CP24: 20.0000 mg | ORAL_CAPSULE | Freq: Every day | ORAL | 0 refills | Status: DC

## 2021-04-05 NOTE — Patient Instructions (Signed)
You will get 3 months at a time. Send a message 1 week prior to running out so we can get it in before you need it.   I recommend getting the flu shot in mid October. This suggestion would change if the CDC comes out with a different recommendation.   Let us know if you need anything.

## 2021-04-05 NOTE — Progress Notes (Signed)
Chief Complaint  Patient presents with   Follow-up    Medication     Tina Blanchard is 30 y.o. female here for ADHD follow up.  Patient is currently on Adderall XR 15 mg/d and compliance is excellent. Symptoms include inattention. Side effects include none. Found out that external factors were causing anxiousness, not the medication.  Patient believes their dose should be increased as efficacy has waned on the 15 mg dose from 20 mg dosage.  Denies tics, difficulties with sleep, self-medication, alcohol/drug abuse, chest pain, or palpitations.  Past Medical History:  Diagnosis Date   GAD (generalized anxiety disorder)     BP 114/61   Pulse 93   Temp (!) 97.5 F (36.4 C) (Oral)   Ht 5\' 6"  (1.676 m)   Wt 255 lb 6 oz (115.8 kg)   SpO2 99%   BMI 41.22 kg/m  Gen- awake, alert, appearing stated age Heart- RRR Lungs- CTAB, no accessory muscle use Neuro- no facial tics Psych- age appropriate judgment and insight, normal mood and affect  Attention deficit hyperactivity disorder (ADHD), combined type, mild - Plan: amphetamine-dextroamphetamine (ADDERALL XR) 20 MG 24 hr capsule, amphetamine-dextroamphetamine (ADDERALL XR) 20 MG 24 hr capsule, amphetamine-dextroamphetamine (ADDERALL XR) 20 MG 24 hr capsule, Drug Monitoring Panel , Urine  Chronic, uncontrolled. UDS and CSC today. Increase Adderall from 15 mg/d to 20 mg/d.  F/u in 6 mo for CPE. Pt voiced understanding and agreement to the plan.  [id number] [place], DO 04/05/21 3:47 PM

## 2021-04-05 NOTE — Addendum Note (Signed)
Addended by: Rosita Kea on: 04/05/2021 03:54 PM   Modules accepted: Orders

## 2021-04-11 LAB — DRUG TOX MONITOR 1 W/CONF, ORAL FLD
Amphetamine: 39 ng/mL — ABNORMAL HIGH (ref <10)
Amphetamines: POSITIVE ng/mL — AB (ref <10)
Barbiturates: NEGATIVE ng/mL (ref <10)
Benzodiazepines: NEGATIVE ng/mL (ref <0.50)
Buprenorphine: NEGATIVE ng/mL (ref <0.10)
Cocaine: NEGATIVE ng/mL (ref <5.0)
Fentanyl: NEGATIVE ng/mL (ref <0.10)
Heroin Metabolite: NEGATIVE ng/mL (ref <1.0)
MARIJUANA: NEGATIVE ng/mL (ref <2.5)
MDMA: NEGATIVE ng/mL (ref <10)
Meprobamate: NEGATIVE ng/mL (ref <2.5)
Methadone: NEGATIVE ng/mL (ref <5.0)
Methamphetamine: NEGATIVE ng/mL (ref <10)
Nicotine Metabolite: NEGATIVE ng/mL (ref <5.0)
Opiates: NEGATIVE ng/mL (ref <2.5)
Phencyclidine: NEGATIVE ng/mL (ref <10)
Tapentadol: NEGATIVE ng/mL (ref <5.0)
Tramadol: NEGATIVE ng/mL (ref <5.0)
Zolpidem: NEGATIVE ng/mL (ref <5.0)

## 2021-05-27 ENCOUNTER — Other Ambulatory Visit: Payer: Self-pay

## 2021-05-27 DIAGNOSIS — F902 Attention-deficit hyperactivity disorder, combined type: Secondary | ICD-10-CM

## 2021-08-01 ENCOUNTER — Telehealth: Payer: BC Managed Care – PPO | Admitting: Physician Assistant

## 2021-08-01 DIAGNOSIS — J019 Acute sinusitis, unspecified: Secondary | ICD-10-CM | POA: Diagnosis not present

## 2021-08-01 DIAGNOSIS — B9689 Other specified bacterial agents as the cause of diseases classified elsewhere: Secondary | ICD-10-CM

## 2021-08-02 MED ORDER — DOXYCYCLINE HYCLATE 100 MG PO TABS: 100.0000 mg | ORAL_TABLET | Freq: Two times a day (BID) | ORAL | 0 refills | Status: DC

## 2021-08-02 NOTE — Progress Notes (Signed)

## 2021-08-27 ENCOUNTER — Other Ambulatory Visit: Payer: Self-pay | Admitting: Family Medicine

## 2021-08-27 DIAGNOSIS — F902 Attention-deficit hyperactivity disorder, combined type: Secondary | ICD-10-CM

## 2021-08-27 MED ORDER — AMPHETAMINE-DEXTROAMPHET ER 20 MG PO CP24: 20.0000 mg | ORAL_CAPSULE | Freq: Every day | ORAL | 0 refills | Status: DC

## 2021-08-27 NOTE — Telephone Encounter (Signed)
Requesting: Adderrall XR 20MG  Contract: 04/05/21 UDS: 04/05/21 Last Visit: 04/05/21 Next Visit: 10/04/21 Last Refill: 06/04/21  Please Advise

## 2021-10-04 ENCOUNTER — Encounter: Payer: Self-pay | Admitting: Family Medicine

## 2021-10-04 ENCOUNTER — Ambulatory Visit (INDEPENDENT_AMBULATORY_CARE_PROVIDER_SITE_OTHER): Payer: BC Managed Care – PPO | Admitting: Family Medicine

## 2021-10-04 VITALS — BP 128/78 | HR 82 | Temp 98.0°F | Resp 16 | Ht 66.0 in | Wt 233.0 lb

## 2021-10-04 DIAGNOSIS — Z0000 Encounter for general adult medical examination without abnormal findings: Secondary | ICD-10-CM

## 2021-10-04 NOTE — Patient Instructions (Addendum)
Give us 2-3 business days to get the results of your labs back.   Keep the diet clean and stay active.  Please get me a copy of your advanced directive form at your convenience.   Call [facility] [place] at [phone] for an appointment.  They are located at [address], [address], [place], [place], [address] (right across the hall from our office).  Let us know if you need anything.  

## 2021-10-04 NOTE — Progress Notes (Signed)
Chief Complaint  ?Patient presents with  ? Annual Exam  ?  Here for Annual Exam   ?  ? ?Well Woman ?Tina Blanchard is here for a complete physical.   ?Her last physical was >1 year ago.  ?Current diet: in general, a "healthy" diet. ?Current exercise: walking. ?Intentionally losing wt.  ?Contraception? Yes ?Fatigue out of ordinary? No ?Seatbelt? Yes ?Advanced directive? No ? ?Health Maintenance ?Pap/HPV- Yes ?Tetanus- Yes ?HIV screening- Yes ?Hep C screening- Yes ? ?Past Medical History:  ?Diagnosis Date  ? GAD (generalized anxiety disorder)   ?  ? ?Past Surgical History:  ?Procedure Laterality Date  ? NO PAST SURGERIES    ? ? ?Medications  ?Current Outpatient Medications on File Prior to Visit  ?Medication Sig Dispense Refill  ? [START ON 10/26/2021] amphetamine-dextroamphetamine (ADDERALL XR) 20 MG 24 hr capsule Take 1 capsule (20 mg total) by mouth daily. 30 capsule 0  ? amphetamine-dextroamphetamine (ADDERALL XR) 20 MG 24 hr capsule Take 1 capsule (20 mg total) by mouth daily. 30 capsule 0  ? amphetamine-dextroamphetamine (ADDERALL XR) 20 MG 24 hr capsule Take 1 capsule (20 mg total) by mouth daily. 30 capsule 0  ? LORazepam (ATIVAN) 1 MG tablet Take 1 tablet (1 mg total) by mouth every 8 (eight) hours as needed for anxiety or sleep. 60 tablet 3  ? PARoxetine (PAXIL) 40 MG tablet Take 1 tablet (40 mg total) by mouth daily. 90 tablet 1  ? ? ?Allergies ?No Known Allergies ? ?Review of Systems: ?Constitutional:  no unexpected weight changes ?Eye:  no recent significant change in vision ?Ear/Nose/Mouth/Throat:  Ears:  no tinnitus or vertigo and no recent change in hearing ?Nose/Mouth/Throat:  no complaints of nasal congestion, no sore throat ?Cardiovascular: no chest pain ?Respiratory:  no cough and no shortness of breath ?Gastrointestinal:  no abdominal pain, no change in bowel habits ?GU:  Female: negative for dysuria or pelvic pain ?Musculoskeletal/Extremities:  no pain of the joints ?Integumentary  (Skin/Breast):  no abnormal skin lesions reported ?Neurologic:  no headaches ?Endocrine:  denies fatigue ?Hematologic/Lymphatic:  No areas of easy bleeding ? ?Exam ?BP 128/78 (BP Location: Right Arm, Patient Position: Sitting, Cuff Size: Normal)   Pulse 82   Temp 98 ?F (36.7 ?C) (Oral)   Resp 16   Ht 5\' 6"  (1.676 m)   Wt 233 lb (105.7 kg)   SpO2 98%   BMI 37.61 kg/m?  ?General:  well developed, well nourished, in no apparent distress ?Skin:  no significant moles, warts, or growths ?Head:  no masses, lesions, or tenderness ?Eyes:  pupils equal and round, sclera anicteric without injection ?Ears:  canals without lesions, TMs shiny without retraction, no obvious effusion, no erythema ?Nose:  nares patent, septum midline, mucosa normal, and no drainage or sinus tenderness ?Throat/Pharynx:  lips and gingiva without lesion; tongue and uvula midline; non-inflamed pharynx; no exudates or postnasal drainage ?Neck: neck supple without adenopathy, thyromegaly, or masses ?Lungs:  clear to auscultation, breath sounds equal bilaterally, no respiratory distress ?Cardio:  regular rate and rhythm, no bruits, no LE edema ?Abdomen:  abdomen soft, nontender; bowel sounds normal; no masses or organomegaly ?Genital: Defer to GYN ?Musculoskeletal:  symmetrical muscle groups noted without atrophy or deformity ?Extremities:  no clubbing, cyanosis, or edema, no deformities, no skin discoloration ?Neuro:  gait normal; deep tendon reflexes normal and symmetric ?Psych: well oriented with normal range of affect and appropriate judgment/insight ? ?Assessment and Plan ? ?Well adult exam - Plan: CBC, Comprehensive metabolic panel,  Lipid panel ? ?Well 31 y.o. female. ?Counseled on diet and exercise. Doing well, lost 40 lbs over last year. Started Adderall, stopped Paxil and increased physical activity.  ?Advanced directive form provided today.  ?GYN info provided in AVS.  ?Other orders as above. ?Follow up in 6 mo. ?The patient voiced  understanding and agreement to the plan. ? ?Sharlene Dory, DO ?10/04/21 ?3:18 PM ? ?

## 2021-10-05 LAB — LIPID PANEL
Chol/HDL Ratio: 2.8 ratio (ref 0.0–4.4)
Cholesterol, Total: 130 mg/dL (ref 100–199)
HDL: 46 mg/dL (ref >39)
LDL Chol Calc (NIH): 70 mg/dL (ref 0–99)
Triglycerides: 66 mg/dL (ref 0–149)
VLDL Cholesterol Cal: 14 mg/dL (ref 5–40)

## 2021-10-05 LAB — COMPREHENSIVE METABOLIC PANEL
ALT: 16 IU/L (ref 0–32)
AST: 17 IU/L (ref 0–40)
Albumin/Globulin Ratio: 1.8 (ref 1.2–2.2)
Albumin: 4.2 g/dL (ref 3.8–4.8)
Alkaline Phosphatase: 88 IU/L (ref 44–121)
BUN/Creatinine Ratio: 7 — ABNORMAL LOW (ref 9–23)
BUN: 7 mg/dL (ref 6–20)
Bilirubin Total: 0.3 mg/dL (ref 0.0–1.2)
CO2: 24 mmol/L (ref 20–29)
Calcium: 9 mg/dL (ref 8.7–10.2)
Chloride: 103 mmol/L (ref 96–106)
Creatinine, Ser: 1.03 mg/dL — ABNORMAL HIGH (ref 0.57–1.00)
Globulin, Total: 2.4 g/dL (ref 1.5–4.5)
Glucose: 99 mg/dL (ref 70–99)
Potassium: 4.3 mmol/L (ref 3.5–5.2)
Sodium: 140 mmol/L (ref 134–144)
Total Protein: 6.6 g/dL (ref 6.0–8.5)
eGFR: 74 mL/min/{1.73_m2} (ref >59)

## 2021-10-05 LAB — CBC
Hematocrit: 37.5 % (ref 34.0–46.6)
Hemoglobin: 12.6 g/dL (ref 11.1–15.9)
MCH: 27.9 pg (ref 26.6–33.0)
MCHC: 33.6 g/dL (ref 31.5–35.7)
MCV: 83 fL (ref 79–97)
Platelets: 352 10*3/uL (ref 150–450)
RBC: 4.52 x10E6/uL (ref 3.77–5.28)
RDW: 13.1 % (ref 11.7–15.4)
WBC: 10 10*3/uL (ref 3.4–10.8)

## 2021-12-25 ENCOUNTER — Telehealth: Payer: Self-pay | Admitting: Family Medicine

## 2021-12-25 DIAGNOSIS — F902 Attention-deficit hyperactivity disorder, combined type: Secondary | ICD-10-CM

## 2021-12-25 MED ORDER — AMPHETAMINE-DEXTROAMPHET ER 20 MG PO CP24: 20.0000 mg | ORAL_CAPSULE | Freq: Every day | ORAL | 0 refills | Status: DC

## 2021-12-25 NOTE — Telephone Encounter (Signed)
Last OV---10/04/2021 Last RF---10/26/2021 CSC/UDS---04/05/2021

## 2021-12-25 NOTE — Telephone Encounter (Signed)
Medication:   amphetamine-dextroamphetamine (ADDERALL XR) 20 MG 24 hr capsule [[id number]]   Has the patient contacted their pharmacy? No. (If no, request that the patient contact the pharmacy for the refill.) (If yes, when and what did the pharmacy advise?)  Preferred Pharmacy (with phone number or street name):   [facility] [address] [place], [place] [address] Phone:[phone]  Agent: Please be advised that RX refills may take up to 3 business days. We ask that you follow-up with your pharmacy.

## 2021-12-30 ENCOUNTER — Encounter: Payer: Self-pay | Admitting: Family Medicine

## 2022-01-27 ENCOUNTER — Other Ambulatory Visit: Payer: Self-pay | Admitting: Family Medicine

## 2022-01-27 DIAGNOSIS — F902 Attention-deficit hyperactivity disorder, combined type: Secondary | ICD-10-CM

## 2022-01-27 MED ORDER — AMPHETAMINE-DEXTROAMPHET ER 20 MG PO CP24: 20.0000 mg | ORAL_CAPSULE | Freq: Every day | ORAL | 0 refills | Status: DC

## 2022-02-26 ENCOUNTER — Other Ambulatory Visit: Payer: Self-pay | Admitting: Family Medicine

## 2022-02-26 DIAGNOSIS — F902 Attention-deficit hyperactivity disorder, combined type: Secondary | ICD-10-CM

## 2022-02-26 MED ORDER — AMPHETAMINE-DEXTROAMPHET ER 20 MG PO CP24: 20.0000 mg | ORAL_CAPSULE | Freq: Every day | ORAL | 0 refills | Status: DC

## 2022-04-08 ENCOUNTER — Ambulatory Visit: Payer: BC Managed Care – PPO | Admitting: Family Medicine

## 2022-04-14 ENCOUNTER — Ambulatory Visit: Payer: BC Managed Care – PPO | Admitting: Family Medicine

## 2022-05-13 ENCOUNTER — Encounter: Payer: Self-pay | Admitting: Family Medicine

## 2022-05-13 ENCOUNTER — Ambulatory Visit: Payer: 59 | Admitting: Family Medicine

## 2022-05-13 VITALS — BP 120/80 | HR 90 | Temp 98.1°F | Ht 66.0 in | Wt 246.1 lb

## 2022-05-13 DIAGNOSIS — Z79899 Other long term (current) drug therapy: Secondary | ICD-10-CM | POA: Diagnosis not present

## 2022-05-13 DIAGNOSIS — F902 Attention-deficit hyperactivity disorder, combined type: Secondary | ICD-10-CM

## 2022-05-13 MED ORDER — AMPHETAMINE-DEXTROAMPHET ER 20 MG PO CP24: 20.0000 mg | ORAL_CAPSULE | Freq: Every day | ORAL | 0 refills | Status: DC

## 2022-05-13 NOTE — Patient Instructions (Signed)
Congratulations on your new job!  Let us know if you need anything.

## 2022-05-13 NOTE — Progress Notes (Signed)
Chief Complaint  Patient presents with   followup on ADHD medications    Tina Blanchard is 31 y.o. female here for ADHD follow up.  Patient is currently on Adderall XR 20 mg/d and compliance is excellent. Symptoms are controlled Side effects include: none. Patient believes their dose should be not significantly changed. Denies tics, weight loss, difficulties with sleep, self-medication, alcohol/drug abuse, chest pain, or palpitations.   Past Medical History:  Diagnosis Date   GAD (generalized anxiety disorder)     BP 120/80 (BP Location: Left Arm, Patient Position: Sitting, Cuff Size: Normal)   Pulse 90   Temp 98.1 F (36.7 C) (Oral)   Ht 5\' 6"  (1.676 m)   Wt 246 lb 2 oz (111.6 kg)   SpO2 99%   BMI 39.73 kg/m  Gen- awake, alert, appearing stated age Heart- RRR Lungs- CTAB, no accessory muscle use Neuro- no facial tics Psych- age appropriate judgment and insight, normal mood and affect  Attention deficit hyperactivity disorder (ADHD), combined type, mild - Plan: amphetamine-dextroamphetamine (ADDERALL XR) 20 MG 24 hr capsule, amphetamine-dextroamphetamine (ADDERALL XR) 20 MG 24 hr capsule, amphetamine-dextroamphetamine (ADDERALL XR) 20 MG 24 hr capsule  Encounter for long-term (current) use of high-risk medication - Plan: Drug Monitoring Panel [id number] , Urine  Chronic, stable. Cont Adderall XR 20 mg/d. UDS and CSC updated. F/u in 6 mo for CPE. Pt voiced understanding and agreement to the plan.  [place], DO 05/13/22 8:40 AM

## 2022-05-16 LAB — DRUG MONITORING PANEL 376104, URINE
Amphetamine: 2272 ng/mL — ABNORMAL HIGH (ref <250)
Amphetamines: POSITIVE ng/mL — AB (ref <500)
Barbiturates: NEGATIVE ng/mL (ref <300)
Benzodiazepines: NEGATIVE ng/mL (ref <100)
Cocaine Metabolite: NEGATIVE ng/mL (ref <150)
Desmethyltramadol: NEGATIVE ng/mL (ref <100)
Methamphetamine: NEGATIVE ng/mL (ref <250)
Opiates: NEGATIVE ng/mL (ref <100)
Oxycodone: NEGATIVE ng/mL (ref <100)
Tramadol: NEGATIVE ng/mL (ref <100)

## 2022-05-16 LAB — DM TEMPLATE

## 2022-11-06 ENCOUNTER — Other Ambulatory Visit: Payer: Self-pay | Admitting: Family Medicine

## 2022-11-06 DIAGNOSIS — F902 Attention-deficit hyperactivity disorder, combined type: Secondary | ICD-10-CM

## 2022-11-06 MED ORDER — AMPHETAMINE-DEXTROAMPHET ER 20 MG PO CP24: 20.0000 mg | ORAL_CAPSULE | Freq: Every day | ORAL | 0 refills | Status: DC

## 2022-11-06 MED ORDER — AMPHETAMINE-DEXTROAMPHET ER 20 MG PO CP24
20.0000 mg | ORAL_CAPSULE | Freq: Every day | ORAL | 0 refills | Status: DC
Start: 2022-11-06 — End: 2023-01-20

## 2022-11-11 ENCOUNTER — Ambulatory Visit: Payer: 59 | Admitting: Family Medicine

## 2022-11-11 VITALS — BP 120/78 | HR 56 | Temp 98.6°F | Ht 66.0 in | Wt 255.0 lb

## 2022-11-11 DIAGNOSIS — F902 Attention-deficit hyperactivity disorder, combined type: Secondary | ICD-10-CM | POA: Diagnosis not present

## 2022-11-11 MED ORDER — AMPHETAMINE-DEXTROAMPHETAMINE 10 MG PO TABS
ORAL_TABLET | ORAL | 0 refills | Status: AC
Start: 2022-11-11 — End: ?

## 2022-11-11 NOTE — Patient Instructions (Signed)
Send me a refill request only if you are doing well.   Come back in 1 month if not doing well with the new change.   Let us know if you need anything.

## 2022-11-11 NOTE — Progress Notes (Signed)
Chief Complaint  Patient presents with   Follow-up    6 month    Tina Blanchard is 32 y.o. female here for ADHD follow up.  Patient is currently on Adderall XR 20 mg/d and compliance is excellent. Symptoms are coming back around 4.5-5 hrs after taking. Side effects include: none. Patient believes their dose should be adjusted to account for the early wearing off. Denies tics, weight loss, difficulties with sleep, self-medication, alcohol/drug abuse, chest pain, or palpitations.  Past Medical History:  Diagnosis Date   GAD (generalized anxiety disorder)     BP 120/78 (BP Location: Left Arm, Patient Position: Sitting, Cuff Size: Large)   Pulse (!) 56   Temp 98.6 F (37 C) (Oral)   Ht 5\' 6"  (1.676 m)   Wt 255 lb (115.7 kg)   SpO2 99%   BMI 41.16 kg/m  Gen- awake, alert, appearing stated age Heart- RRR Lungs- CTAB, no accessory muscle use Neuro- patellar DTRs 3/4 b/l, no clonus, gait nml Psych- age appropriate judgment and insight, normal mood and affect  Attention deficit hyperactivity disorder (ADHD), combined type, mild - Plan: amphetamine-dextroamphetamine (ADDERALL) 10 MG tablet  Chronic, unstable. Cont Adderall XR 20 mg/d. Add Adderall 10 mg at noon. Send refill request in 1 mo if doing well, will come back in 1 mo if not ideally managed.  F/u in 6 mo for CPE. Pt voiced understanding and agreement to the plan.  Jilda Roche [place], DO 11/11/22 7:56 AM

## 2023-01-20 ENCOUNTER — Other Ambulatory Visit: Payer: Self-pay | Admitting: Family Medicine

## 2023-01-20 DIAGNOSIS — F902 Attention-deficit hyperactivity disorder, combined type: Secondary | ICD-10-CM

## 2023-01-22 MED ORDER — AMPHETAMINE-DEXTROAMPHET ER 20 MG PO CP24: 20.0000 mg | ORAL_CAPSULE | Freq: Every day | ORAL | 0 refills | Status: DC

## 2023-01-22 MED ORDER — AMPHETAMINE-DEXTROAMPHETAMINE 10 MG PO TABS
ORAL_TABLET | ORAL | 0 refills | Status: DC
Start: 2023-01-22 — End: 2023-03-12

## 2023-01-22 NOTE — Telephone Encounter (Signed)
Requesting: adderall xr 20mg  and adderall 10mg   Contract: 05/28/22 UDS: 05/13/22 Last Visit: 11/11/22 Next Visit: 05/13/23 Last Refill: 11/06/22 #30 and 0JW    Please Advise

## 2023-03-12 ENCOUNTER — Other Ambulatory Visit: Payer: Self-pay | Admitting: Family Medicine

## 2023-03-12 DIAGNOSIS — F902 Attention-deficit hyperactivity disorder, combined type: Secondary | ICD-10-CM

## 2023-03-12 MED ORDER — AMPHETAMINE-DEXTROAMPHET ER 20 MG PO CP24
20.0000 mg | ORAL_CAPSULE | Freq: Every day | ORAL | 0 refills | Status: DC
Start: 2023-04-11 — End: 2023-09-18

## 2023-03-12 MED ORDER — AMPHETAMINE-DEXTROAMPHET ER 20 MG PO CP24: 20.0000 mg | ORAL_CAPSULE | Freq: Every day | ORAL | 0 refills | Status: DC

## 2023-03-12 MED ORDER — AMPHETAMINE-DEXTROAMPHETAMINE 10 MG PO TABS
ORAL_TABLET | ORAL | 0 refills | Status: DC
Start: 2023-03-12 — End: 2023-05-11

## 2023-05-11 ENCOUNTER — Other Ambulatory Visit: Payer: Self-pay | Admitting: Family Medicine

## 2023-05-11 DIAGNOSIS — F902 Attention-deficit hyperactivity disorder, combined type: Secondary | ICD-10-CM

## 2023-05-11 MED ORDER — AMPHETAMINE-DEXTROAMPHETAMINE 10 MG PO TABS
ORAL_TABLET | ORAL | 0 refills | Status: DC
Start: 2023-05-11 — End: 2023-05-11

## 2023-05-11 MED ORDER — AMPHETAMINE-DEXTROAMPHETAMINE 10 MG PO TABS
ORAL_TABLET | ORAL | 0 refills | Status: DC
Start: 2023-05-11 — End: 2023-09-17

## 2023-05-13 ENCOUNTER — Encounter: Payer: 59 | Admitting: Family Medicine

## 2023-06-02 ENCOUNTER — Ambulatory Visit (INDEPENDENT_AMBULATORY_CARE_PROVIDER_SITE_OTHER): Payer: 59 | Admitting: Family Medicine

## 2023-06-02 ENCOUNTER — Encounter: Payer: Self-pay | Admitting: Family Medicine

## 2023-06-02 VITALS — BP 120/76 | HR 88 | Temp 98.0°F | Resp 16 | Ht 66.0 in | Wt 256.0 lb

## 2023-06-02 DIAGNOSIS — Z0000 Encounter for general adult medical examination without abnormal findings: Secondary | ICD-10-CM

## 2023-06-02 DIAGNOSIS — Z1283 Encounter for screening for malignant neoplasm of skin: Secondary | ICD-10-CM

## 2023-06-02 DIAGNOSIS — Z79899 Other long term (current) drug therapy: Secondary | ICD-10-CM

## 2023-06-02 DIAGNOSIS — R0683 Snoring: Secondary | ICD-10-CM

## 2023-06-02 NOTE — Progress Notes (Signed)
Chief Complaint  Patient presents with   Annual Exam    Annual Exam     Well Woman Tina Blanchard is here for a complete physical.   Her last physical was >1 year ago.  Current diet: in general, diet could be better, could be worse Current exercise: walking Contraception? Yes Fatigue out of ordinary? yes Seatbelt? Yes Advanced directive? No  Health Maintenance Pap/HPV- due Tetanus- Yes HIV screening- Yes Hep C screening- Yes  Past Medical History:  Diagnosis Date   GAD (generalized anxiety disorder)      Past Surgical History:  Procedure Laterality Date   NO PAST SURGERIES      Medications  Current Outpatient Medications on File Prior to Visit  Medication Sig Dispense Refill   amphetamine-dextroamphetamine (ADDERALL XR) 20 MG 24 hr capsule Take 1 capsule (20 mg total) by mouth daily. 30 capsule 0   amphetamine-dextroamphetamine (ADDERALL XR) 20 MG 24 hr capsule Take 1 capsule (20 mg total) by mouth daily. 30 capsule 0   amphetamine-dextroamphetamine (ADDERALL XR) 20 MG 24 hr capsule Take 1 capsule (20 mg total) by mouth daily. 30 capsule 0   amphetamine-dextroamphetamine (ADDERALL) 10 MG tablet Take 1 tab by mouth daily at noon. 30 tablet 0    Allergies No Known Allergies  Review of Systems: Constitutional:  no unexpected weight changes Eye:  no recent significant change in vision Ear/Nose/Mouth/Throat:  Ears:  no tinnitus or vertigo and no recent change in hearing Nose/Mouth/Throat:  no complaints of nasal congestion, no sore throat Cardiovascular: no chest pain Respiratory:  no cough and no shortness of breath Gastrointestinal:  no abdominal pain, no change in bowel habits GU:  Female: negative for dysuria or pelvic pain Musculoskeletal/Extremities:  no pain of the joints Integumentary (Skin/Breast):  +raised area on back Neurologic:  no headaches Endocrine: + fatigue Hematologic/Lymphatic:  No areas of easy bleeding  Exam BP 120/76 (BP Location: Left  Arm, Patient Position: Sitting, Cuff Size: Normal)   Pulse 88   Temp 98 F (36.7 C) (Oral)   Resp 16   Ht 5\' 6"  (1.676 m)   Wt 256 lb (116.1 kg)   SpO2 99%   BMI 41.32 kg/m  General:  well developed, well nourished, in no apparent distress Skin:  no significant moles, warts, or growths Head:  no masses, lesions, or tenderness Eyes:  pupils equal and round, sclera anicteric without injection Ears:  canals without lesions, TMs shiny without retraction, no obvious effusion, no erythema Nose:  nares patent, mucosa normal, and no drainage  Throat/Pharynx:  lips and gingiva without lesion; tongue and uvula midline; non-inflamed pharynx; no exudates or postnasal drainage Neck: neck supple without adenopathy, thyromegaly, or masses Lungs:  clear to auscultation, breath sounds equal bilaterally, no respiratory distress Cardio:  regular rate and rhythm, no bruits, no LE edema Abdomen:  abdomen soft, nontender; bowel sounds normal; no masses or organomegaly Genital: Defer to GYN Musculoskeletal:  symmetrical muscle groups noted without atrophy or deformity Extremities:  no clubbing, cyanosis, or edema, no deformities, no skin discoloration Neuro:  gait normal; deep tendon reflexes normal and symmetric Psych: well oriented with normal range of affect and appropriate judgment/insight  Assessment and Plan  Well adult exam - Plan: CBC, Comprehensive metabolic panel, Lipid panel  High risk medication use - Plan: Drug Monitoring Panel [phone] , Urine  Snoring - Plan: Ambulatory referral to Neurology  Skin cancer screening - Plan: Ambulatory referral to Dermatology   Well 32 y.o. female. Counseled on  diet and exercise. Advanced directive form provided today.  Politely declined flu shot.  UDS and CSC updated today.  Referral to sleep team for possible OSA. Snoring, gasping for air in middle of night, fatigue.  Other orders as above. Follow up in 6 mo. The patient voiced understanding and  agreement to the plan.  Tina Roche [place], DO 06/02/23 2:00 PM

## 2023-06-02 NOTE — Patient Instructions (Addendum)
Call Center for [facility] at [facility] at [phone] for an appointment.  They are located at [address], [address], [place], [place], [address] (right across the hall from our office).  Give [place] 2-3 business days to get the results of your labs back.   Keep the diet clean and stay active.  Please get me a copy of your advanced directive form at your convenience.   If you do not hear anything about your referral in the next 1-2 weeks, call our office and ask for an update.  Let us know if you need anything.

## 2023-06-03 ENCOUNTER — Other Ambulatory Visit: Payer: Self-pay

## 2023-06-03 DIAGNOSIS — R7309 Other abnormal glucose: Secondary | ICD-10-CM

## 2023-06-03 LAB — LIPID PANEL
Cholesterol: 121 mg/dL (ref 0–200)
HDL: 41.5 mg/dL (ref >39.00)
LDL Cholesterol: 69 mg/dL (ref 0–99)
NonHDL: 79.07
Total CHOL/HDL Ratio: 3
Triglycerides: 48 mg/dL (ref 0.0–149.0)
VLDL: 9.6 mg/dL (ref 0.0–40.0)

## 2023-06-03 LAB — COMPREHENSIVE METABOLIC PANEL
ALT: 16 U/L (ref 0–35)
AST: 13 U/L (ref 0–37)
Albumin: 3.9 g/dL (ref 3.5–5.2)
Alkaline Phosphatase: 81 U/L (ref 39–117)
BUN: 9 mg/dL (ref 6–23)
CO2: 26 meq/L (ref 19–32)
Calcium: 8.6 mg/dL (ref 8.4–10.5)
Chloride: 104 meq/L (ref 96–112)
Creatinine, Ser: 0.86 mg/dL (ref 0.40–1.20)
GFR: 88.09 mL/min (ref >60.00)
Glucose, Bld: 109 mg/dL — ABNORMAL HIGH (ref 70–99)
Potassium: 3.7 meq/L (ref 3.5–5.1)
Sodium: 136 meq/L (ref 135–145)
Total Bilirubin: 0.3 mg/dL (ref 0.2–1.2)
Total Protein: 6.5 g/dL (ref 6.0–8.3)

## 2023-06-03 LAB — CBC
HCT: 37.7 % (ref 36.0–46.0)
Hemoglobin: 12.3 g/dL (ref 12.0–15.0)
MCHC: 32.6 g/dL (ref 30.0–36.0)
MCV: 83.6 fL (ref 78.0–100.0)
Platelets: 329 10*3/uL (ref 150.0–400.0)
RBC: 4.51 Mil/uL (ref 3.87–5.11)
RDW: 14.2 % (ref 11.5–15.5)
WBC: 8.9 10*3/uL (ref 4.0–10.5)

## 2023-06-04 ENCOUNTER — Ambulatory Visit (INDEPENDENT_AMBULATORY_CARE_PROVIDER_SITE_OTHER): Payer: 59

## 2023-06-04 DIAGNOSIS — R7309 Other abnormal glucose: Secondary | ICD-10-CM | POA: Diagnosis not present

## 2023-06-04 LAB — DRUG MONITORING PANEL 376104, URINE
Amphetamine: 931 ng/mL — ABNORMAL HIGH (ref <250)
Amphetamines: POSITIVE ng/mL — AB (ref <500)
Barbiturates: NEGATIVE ng/mL (ref <300)
Benzodiazepines: NEGATIVE ng/mL (ref <100)
Cocaine Metabolite: NEGATIVE ng/mL (ref <150)
Desmethyltramadol: NEGATIVE ng/mL (ref <100)
Methamphetamine: NEGATIVE ng/mL (ref <250)
Opiates: NEGATIVE ng/mL (ref <100)
Oxycodone: NEGATIVE ng/mL (ref <100)
Tramadol: NEGATIVE ng/mL (ref <100)

## 2023-06-04 LAB — DM TEMPLATE

## 2023-06-04 LAB — HEMOGLOBIN A1C: Hgb A1c MFr Bld: 5.9 % (ref 4.6–6.5)

## 2023-09-17 ENCOUNTER — Other Ambulatory Visit: Payer: Self-pay | Admitting: Family Medicine

## 2023-09-17 DIAGNOSIS — F902 Attention-deficit hyperactivity disorder, combined type: Secondary | ICD-10-CM

## 2023-09-18 MED ORDER — AMPHETAMINE-DEXTROAMPHET ER 20 MG PO CP24: 20.0000 mg | ORAL_CAPSULE | Freq: Every day | ORAL | 0 refills | Status: DC

## 2023-09-18 MED ORDER — AMPHETAMINE-DEXTROAMPHETAMINE 10 MG PO TABS: ORAL_TABLET | ORAL | 0 refills | Status: DC

## 2023-09-18 NOTE — Telephone Encounter (Signed)
 Requesting: Adderall  Contract: Yes, 06/02/2023 UDS: 06/02/2023 Last Visit: 06/02/2023 Next Visit: Visit date not found Last Refill: 05/11/2023  Please Advise

## 2023-12-10 ENCOUNTER — Ambulatory Visit: Admitting: Dermatology

## 2023-12-30 ENCOUNTER — Encounter: Payer: Self-pay | Admitting: Family Medicine

## 2023-12-30 ENCOUNTER — Ambulatory Visit: Admitting: Family Medicine

## 2023-12-30 VITALS — BP 122/78 | HR 84 | Temp 98.0°F | Resp 16 | Ht 66.0 in | Wt 263.0 lb

## 2023-12-30 DIAGNOSIS — F902 Attention-deficit hyperactivity disorder, combined type: Secondary | ICD-10-CM | POA: Diagnosis not present

## 2023-12-30 DIAGNOSIS — Z79899 Other long term (current) drug therapy: Secondary | ICD-10-CM

## 2023-12-30 MED ORDER — AMPHETAMINE-DEXTROAMPHETAMINE 10 MG PO TABS: ORAL_TABLET | ORAL | 0 refills | Status: DC

## 2023-12-30 MED ORDER — AMPHETAMINE-DEXTROAMPHET ER 20 MG PO CP24: 20.0000 mg | ORAL_CAPSULE | Freq: Every day | ORAL | 0 refills | Status: DC

## 2023-12-30 NOTE — Progress Notes (Signed)
 Chief Complaint  Patient presents with   Medication Refill    Medication Refill    Tina Blanchard is 33 y.o. female here for ADHD follow up.  Patient is currently on Adderall XR 20 mg/d, 10 mg short acting in the afternoon prn. Symptoms: none, well controlled Side effects include: none. Patient believes their dose should be not significantly changed. Denies tics, weight loss, difficulties with sleep, self-medication, alcohol/drug abuse, chest pain, or palpitations.   Past Medical History:  Diagnosis Date   GAD (generalized anxiety disorder)     BP 122/78 (BP Location: Left Arm, Patient Position: Sitting)   Pulse 84   Temp 98 F (36.7 C) (Oral)   Resp 16   Ht 5' 6 (1.676 m)   Wt 263 lb (119.3 kg)   SpO2 100%   BMI 42.45 kg/m  Gen- awake, alert, appearing stated age Heart- RRR Lungs- CTAB, no accessory muscle use Neuro- no facial tics Psych- age appropriate judgment and insight, normal mood and affect  Attention deficit hyperactivity disorder (ADHD), combined type, mild - Plan: amphetamine -dextroamphetamine  (ADDERALL) 10 MG tablet, amphetamine -dextroamphetamine  (ADDERALL XR) 20 MG 24 hr capsule, amphetamine -dextroamphetamine  (ADDERALL XR) 20 MG 24 hr capsule, amphetamine -dextroamphetamine  (ADDERALL XR) 20 MG 24 hr capsule  High risk medication use - Plan: Drug Monitoring Panel [id number] , Urine  Chronic, stable. Cont Adderall XR 20 mg/d, 10 mg in afternoon prn.  F/u in 6 mo for CPE. Pt voiced understanding and agreement to the plan.  Shellie Dials [place], DO 12/30/23 8:28 AM

## 2023-12-30 NOTE — Patient Instructions (Signed)
Let us know if you need anything.

## 2024-04-18 ENCOUNTER — Other Ambulatory Visit: Payer: Self-pay

## 2024-04-18 ENCOUNTER — Emergency Department (HOSPITAL_BASED_OUTPATIENT_CLINIC_OR_DEPARTMENT_OTHER)
Admission: EM | Admit: 2024-04-18 | Discharge: 2024-04-18 | Disposition: A | Discharge status: Home / Self Care | Attending: Emergency Medicine | Admitting: Emergency Medicine

## 2024-04-18 ENCOUNTER — Telehealth: Payer: Self-pay

## 2024-04-18 ENCOUNTER — Encounter (HOSPITAL_BASED_OUTPATIENT_CLINIC_OR_DEPARTMENT_OTHER): Payer: Self-pay | Admitting: Emergency Medicine

## 2024-04-18 ENCOUNTER — Emergency Department (HOSPITAL_BASED_OUTPATIENT_CLINIC_OR_DEPARTMENT_OTHER)

## 2024-04-18 DIAGNOSIS — R202 Paresthesia of skin: Secondary | ICD-10-CM | POA: Insufficient documentation

## 2024-04-18 LAB — CBC
HCT: 39 % (ref 36.0–46.0)
Hemoglobin: 12.8 g/dL (ref 12.0–15.0)
MCH: 27.1 pg (ref 26.0–34.0)
MCHC: 32.8 g/dL (ref 30.0–36.0)
MCV: 82.5 fL (ref 80.0–100.0)
Platelets: 311 K/uL (ref 150–400)
RBC: 4.73 MIL/uL (ref 3.87–5.11)
RDW: 13.9 % (ref 11.5–15.5)
WBC: 9.9 K/uL (ref 4.0–10.5)
nRBC: 0 % (ref 0.0–0.2)

## 2024-04-18 LAB — COMPREHENSIVE METABOLIC PANEL WITH GFR
ALT: 35 U/L (ref 0–44)
AST: 27 U/L (ref 15–41)
Albumin: 4.2 g/dL (ref 3.5–5.0)
Alkaline Phosphatase: 99 U/L (ref 38–126)
Anion gap: 10 (ref 5–15)
BUN: 7 mg/dL (ref 6–20)
CO2: 25 mmol/L (ref 22–32)
Calcium: 9.3 mg/dL (ref 8.9–10.3)
Chloride: 102 mmol/L (ref 98–111)
Creatinine, Ser: 0.93 mg/dL (ref 0.44–1.00)
GFR, Estimated: >60 mL/min (ref >60)
Glucose, Bld: 125 mg/dL — ABNORMAL HIGH (ref 70–99)
Potassium: 3.8 mmol/L (ref 3.5–5.1)
Sodium: 138 mmol/L (ref 135–145)
Total Bilirubin: 0.4 mg/dL (ref 0.0–1.2)
Total Protein: 7.3 g/dL (ref 6.5–8.1)

## 2024-04-18 NOTE — ED Triage Notes (Signed)
 Pt sent from pcp- c/o tingling to top of head that radiates down face, with small amount of numbness, intermittent x 2 weeks. Reports eyelid twitching x 2 days.   Denies speech, vision changes, changes in medications. Pt speaking in clear sentences, respirations equal and unlabored.

## 2024-04-18 NOTE — Telephone Encounter (Signed)
FYI. Pt going to ED.  

## 2024-04-18 NOTE — Telephone Encounter (Signed)
 Initial Comment Caller states she is having a tingling sensation going down the side of her face. Translation No Nurse Assessment Nurse: Felicia, Tina Blanchard, Tina Blanchard Date/Time Titus Time): 04/18/2024 1:31:20 PM Confirm and document reason for call. If symptomatic, describe symptoms. ---Patient reports tingling sensation on one side of her face for the past couple of weeks. Symptom has occurred on both sides of her face but not at the same time. Symptom some times lasts for a couple of minutes but has also lasted an hour. Patient reports it is ongoing during call to left side of her face, has been ongoing for 10 mins Does the patient have any new or worsening symptoms? ---Yes Will a triage be completed? ---Yes Related visit to physician within the last 2 weeks? ---No Does the PT have any chronic conditions? (i.e. diabetes, asthma, this includes High risk factors for pregnancy, etc.) ---No Is the patient pregnant or possibly pregnant? (Ask all females between the ages of 3-55) ---No Is this a behavioral health or substance abuse call? ---No Guidelines Guideline Title Affirmed Question Affirmed Notes Nurse Date/Time (Eastern Time) Neurologic Deficit [1] Numbness (i.e., loss of sensation) of the face, arm / hand, or leg / foot on one side of the body AND Felicia, Tina Blanchard, Tina Blanchard 04/18/2024 1:35:03 PM PLEASE NOTE: All timestamps contained within this report are represented as [place] Standard Time. CONFIDENTIALTY NOTICE: This fax transmission is intended only for the addressee. It contains information that is legally privileged, confidential or otherwise protected from use or disclosure. If you are not the intended recipient, you are strictly prohibited from reviewing, disclosing, copying using or disseminating any of this information or taking any action in reliance on or regarding this information. If you have received this fax in error, please notify us  immediately by telephone so  that we can arrange for its return to us . Phone: [phone], Toll-Free: [phone], Fax: [phone] Tina Blanchard December 19, 1990 Page: 1 of2 CallId: [id number] Guidelines Guideline Title Affirmed Question Affirmed Notes Nurse Date/Time Titus Time) [2] sudden onset AND [3] present now Disp. Time Titus Time) Disposition Final User 04/18/2024 1:25:09 PM Send to Urgent Queue Tina Blanchard 04/18/2024 1:37:47 PM Call EMS 911 Now Yes Tina Blanchard 04/18/2024 1:41:41 PM 911 Outcome Documentation Felicia, Tina Blanchard, Tina Blanchard Reason: Patient declined 911 disposition, stating she needs to figure out child care for her son first and once that is done, she will go to the ER. Final Disposition 04/18/2024 1:37:47 PM Call EMS 911 Now Yes Felicia, Tina Blanchard, Tina Blanchard [place] Disagree/Comply Disagree Caller Understands Yes PreDisposition InappropriateToAsk Care Advice Given Per Guideline CALL EMS 911 NOW: * Immediate medical attention is needed. You need to hang up and call 911 (or an ambulance). * Triager Discretion: I'll call you back in a few minutes to be sure you were able to reach them. CARE ADVICE given per Neurologic Deficit (Adult) guideline

## 2024-04-18 NOTE — Discharge Instructions (Signed)
 It was a pleasure taking care of you here today  As we discussed your workup is reassuring.  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  Would recommend follow-up with primary care provider.  If you have any new, worsening symptoms, persistence of your symptoms please go to the emergency department for an MRI

## 2024-04-18 NOTE — ED Provider Notes (Signed)
 [facility] Provider Note   CSN: [id number] Arrival date & time: 04/18/24  1616    Patient presents with: Tingling   Tina Blanchard is a 33 y.o. female here for evaluation of paresthesias.  Over the last 2 weeks she has had intermittent migratory paresthesias.  Last week she had some intermittent paresthesias lasting a few seconds to left side of her face.  Today she had about an hour long episode of tingling where it started in her face, over 5 to 10 minutes and gradually went to the right side of her body.  She denies any vision changes, current headache, neck weakness, slurred speech.  No history of diabetes, hypertension, hypercholesterolemia.  She does have a history of complicated migraines however has been a few years since she had a migraine.  Typically gets migraine with aura.  She states she did had some mild tension headaches last week however no headache today.  No sudden onset thunderclap headache.  She has no personal history of aneurysm however mother does have history of brain aneurysm.  No neck pain, neck stiffness, chest pain, shortness of breath, nausea vomiting, abdominal pain.   HPI     Prior to Admission medications   Medication Sig Start Date End Date Taking? Authorizing Provider  amphetamine -dextroamphetamine  (ADDERALL XR) 20 MG 24 hr capsule Take 1 capsule (20 mg total) by mouth daily. 01/29/24   Frann Mabel Mt, DO  amphetamine -dextroamphetamine  (ADDERALL XR) 20 MG 24 hr capsule Take 1 capsule (20 mg total) by mouth daily. 12/30/23   Frann Mabel Mt, DO  amphetamine -dextroamphetamine  (ADDERALL XR) 20 MG 24 hr capsule Take 1 capsule (20 mg total) by mouth daily. 02/28/24   Frann Mabel Mt, DO  amphetamine -dextroamphetamine  (ADDERALL) 10 MG tablet Take 1 tab by mouth daily at noon. 12/30/23   Frann Mabel Mt, DO    Allergies: Patient has no known allergies.    Review of Systems   Constitutional: Negative.   HENT: Negative.    Respiratory: Negative.    Cardiovascular: Negative.   Gastrointestinal: Negative.   Genitourinary: Negative.   Musculoskeletal: Negative.   Skin: Negative.   Neurological:  Positive for numbness and headaches (last week, resolved). Negative for dizziness, tremors, seizures, syncope, facial asymmetry, speech difficulty, weakness and light-headedness.  All other systems reviewed and are negative.   Updated Vital Signs BP (!) 145/108 (BP Location: Right Arm)   Pulse 95   Temp 98 F (36.7 C) (Oral)   Resp 18   Ht 5' 6 (1.676 m)   Wt 113.4 kg   LMP 03/30/2024 (Approximate)   SpO2 100%   BMI 40.35 kg/m   Physical Exam Physical Exam  Constitutional: Pt is oriented to person, place, and time. Pt appears well-developed and well-nourished. No distress.  HENT:  Head: Normocephalic and atraumatic.  Mouth/Throat: Oropharynx is clear and moist.  Eyes: Conjunctivae and EOM are normal. Pupils are equal, round, and reactive to light. No scleral icterus.  No horizontal, vertical or rotational nystagmus  Neck: Normal range of motion. Neck supple.  Full active and passive ROM without pain No midline or paraspinal tenderness No nuchal rigidity or meningeal signs  Cardiovascular: Normal rate, regular rhythm and intact distal pulses.   Pulmonary/Chest: Effort normal and breath sounds normal. No respiratory distress. Pt has no wheezes. No rales.  Abdominal: Soft. Bowel sounds are normal. There is no tenderness. There is no rebound and no guarding.  Musculoskeletal: Normal range of motion.  Lymphadenopathy:  No cervical adenopathy.  Neurological: Pt. is alert and oriented to person, place, and time. He has normal reflexes. No cranial nerve deficit.  Exhibits normal muscle tone. Coordination normal.  Mental Status:  Alert, oriented, thought content appropriate. Speech fluent without evidence of aphasia. Able to follow 2 step commands without  difficulty.  Cranial Nerves:  2-12 grossly intact Motor:  5/5 in upper and lower extremities bilaterally  Sensory: Intact sensation BIL Cerebellar: normal F2N Gait: normal gait and balance CV: distal pulses palpable throughout   Skin: Skin is warm and dry. No rash noted. Pt is not diaphoretic.  Psychiatric: Pt has a normal mood and affect. Behavior is normal. Judgment and thought content normal.  Nursing note and vitals reviewed.  (all labs ordered are listed, but only abnormal results are displayed) Labs Reviewed  COMPREHENSIVE METABOLIC PANEL WITH GFR - Abnormal; Notable for the following components:      Result Value   Glucose, Bld 125 (*)    All other components within normal limits  CBC  PREGNANCY, URINE    EKG: None  Radiology: CT Head Wo Contrast Result Date: 04/18/2024 CLINICAL DATA:  Numbness or tingling, paresthesia (Ped 0-17y) 33 y/o female c/o tingling to top of head that radiates down face, with small amount of numbness, intermittent x 2 weeks. EXAM: CT HEAD WITHOUT CONTRAST TECHNIQUE: Contiguous axial images were obtained from the base of the skull through the vertex without intravenous contrast. RADIATION DOSE REDUCTION: This exam was performed according to the departmental dose-optimization program which includes automated exposure control, adjustment of the mA and/or kV according to patient size and/or use of iterative reconstruction technique. COMPARISON:  CT head 08/22/2009 FINDINGS: Brain: No evidence of large-territorial acute infarction. No parenchymal hemorrhage. No mass lesion. No extra-axial collection. No mass effect or midline shift. No hydrocephalus. Basilar cisterns are patent. Vascular: No hyperdense vessel. Skull: No acute fracture or focal lesion. Sinuses/Orbits: Air-fluid level within the right maxillary sinus. Low-density mucosal thickening within the right maxillary sinus. Otherwise paranasal sinuses and mastoid air cells are clear. The orbits are  unremarkable. Other: None. IMPRESSION: No acute intracranial abnormality. Electronically Signed   By: Morgane  Naveau M.D.   On: 04/18/2024 18:04     Procedures   Medications Ordered in the ED - No data to display  33 year old history of migraine with aura here for evaluation of migrating paresthesias over the last 2 weeks.  Typically involve left face lasts a few seconds to a few minutes and self resolves.  Today she had an episode started on the right side of her face and about 5 to 10 minutes progressed to her right upper extremity and right lower extremity.  No vision changes, headache, specifically no sudden onset thunderclap headache, neck pain, slurred speech, weakness, chest pain, abdominal pain.  She had a tension headache last week which was mild per the patient.  She called her PCP who told her she needs to be evaluated here.  Here patient has a nonfocal neuroexam.  She appears otherwise well.  Will plan on labs and imaging, consult neurology  Labs and imaging personally viewed interpreted:  CBC without leukocytosis Metabolic panel glucose 125 CT head without significant abnormality  Discussed with neurology.  Recommends outpatient follow-up.  No need for MRI at this time as likely probably migraine variant.  If she has worsening symptoms or persistent symptoms need to be seen for an MRI at that time.  I discussed results with patient.  She is agreeable for outpatient  follow-up.  At this time I low suspicion for acute CVA, dissection, mass, SAH, IIH, bacterial infectious process/meningitis/encephalitis, dural sinus thrombosis, traumatic injury  The patient has been appropriately medically screened and/or stabilized in the ED. I have low suspicion for any other emergent medical condition which would require further screening, evaluation or treatment in the ED or require inpatient management.  Patient is hemodynamically stable and in no acute distress.  Patient able to ambulate in  department prior to ED.  Evaluation does not show acute pathology that would require ongoing or additional emergent interventions while in the emergency department or further inpatient treatment.  I have discussed the diagnosis with the patient and answered all questions.  Pain is been managed while in the emergency department and patient has no further complaints prior to discharge.  Patient is comfortable with plan discussed in room and is stable for discharge at this time.  I have discussed strict return precautions for returning to the emergency department.  Patient was encouraged to follow-up with PCP/specialist refer to at discharge.     Clinical Course as of 04/18/24 1926  Mon Apr 18, 2024  8155 Discussed with neurology, Dr. Michaela.  States this sounds like a complicated migraine given her migrating paresthesias and timing.  No need for further imaging currently.  He does recommend if she has recurrent symptoms or persistent symptoms to be seen again for MRI. [BH]    Clinical Course User Index [BH] Wilmarie Sparlin A, PA-C                                   Medical Decision Making Amount and/or Complexity of Data Reviewed External Data Reviewed: labs, radiology and notes. Labs: ordered. Decision-making details documented in ED Course. Radiology: ordered and independent interpretation performed. Decision-making details documented in ED Course.  Risk OTC drugs. Decision regarding hospitalization. Diagnosis or treatment significantly limited by social determinants of health.       Final diagnoses:  Paresthesias    ED Discharge Orders     None          Skiler Olden A, PA-C 04/18/24 1926    Dreama Longs, MD 04/19/24 1246

## 2024-04-25 ENCOUNTER — Ambulatory Visit: Admitting: Family Medicine

## 2024-04-25 ENCOUNTER — Other Ambulatory Visit: Payer: Self-pay | Admitting: Family Medicine

## 2024-04-25 DIAGNOSIS — F902 Attention-deficit hyperactivity disorder, combined type: Secondary | ICD-10-CM

## 2024-04-25 NOTE — Telephone Encounter (Unsigned)
 Copied from CRM [id number]. Topic: Clinical - Medication Refill >> Apr 25, 2024  4:27 PM Drema MATSU wrote: Medication: amphetamine -dextroamphetamine  (ADDERALL) 10 MG tablet and  amphetamine -dextroamphetamine  (ADDERALL XR) 20 MG 24 hr capsule Has the patient contacted their pharmacy? Yes (Agent: If no, request that the patient contact the pharmacy for the refill. If patient does not wish to contact the pharmacy document the reason why and proceed with request.) call provider (Agent: If yes, when and what did the pharmacy advise?)  This is the patient's preferred pharmacy:  [id number] - [place], [place] - [address] [place] [address] Phone: [phone] Fax: [phone]  Is this the correct pharmacy for this prescription? Yes If no, delete pharmacy and type the correct one.   Has the prescription been filled recently? Yes  Is the patient out of the medication? Yes  Has the patient been seen for an appointment in the last year OR does the patient have an upcoming appointment? Yes  Can we respond through MyChart? No  Agent: Please be advised that Rx refills may take up to 3 business days. We ask that you follow-up with your pharmacy.

## 2024-04-26 MED ORDER — AMPHETAMINE-DEXTROAMPHET ER 20 MG PO CP24: 20.0000 mg | ORAL_CAPSULE | Freq: Every day | ORAL | 0 refills | Status: DC

## 2024-04-26 MED ORDER — AMPHETAMINE-DEXTROAMPHETAMINE 10 MG PO TABS: ORAL_TABLET | ORAL | 0 refills | Status: DC

## 2024-04-26 NOTE — Telephone Encounter (Signed)
 Plz ensure she has her 6 mo f/u in Dec. Thx.

## 2024-04-26 NOTE — Telephone Encounter (Signed)
 Requesting: Adderall XR 20mg  and Adderall 10mg  Contract: 01/11/24 UDS: 06/02/23 Last Visit: 12/30/23 Next Visit: None Last Refill: see med list  Please Advise

## 2024-06-22 ENCOUNTER — Ambulatory Visit: Admitting: Family Medicine

## 2024-06-22 VITALS — BP 128/80 | HR 100 | Temp 97.8°F | Resp 16 | Ht 66.0 in | Wt 255.0 lb

## 2024-06-22 DIAGNOSIS — F411 Generalized anxiety disorder: Secondary | ICD-10-CM | POA: Diagnosis not present

## 2024-06-22 DIAGNOSIS — F339 Major depressive disorder, recurrent, unspecified: Secondary | ICD-10-CM | POA: Diagnosis not present

## 2024-06-22 DIAGNOSIS — Z79899 Other long term (current) drug therapy: Secondary | ICD-10-CM | POA: Diagnosis not present

## 2024-06-22 DIAGNOSIS — G5702 Lesion of sciatic nerve, left lower limb: Secondary | ICD-10-CM | POA: Diagnosis not present

## 2024-06-22 DIAGNOSIS — Z0000 Encounter for general adult medical examination without abnormal findings: Secondary | ICD-10-CM

## 2024-06-22 DIAGNOSIS — F902 Attention-deficit hyperactivity disorder, combined type: Secondary | ICD-10-CM | POA: Diagnosis not present

## 2024-06-22 MED ORDER — AMPHETAMINE-DEXTROAMPHET ER 20 MG PO CP24: 20.0000 mg | ORAL_CAPSULE | Freq: Every day | ORAL | 0 refills | Status: AC

## 2024-06-22 MED ORDER — AMPHETAMINE-DEXTROAMPHETAMINE 10 MG PO TABS: ORAL_TABLET | ORAL | 0 refills | Status: AC

## 2024-06-22 MED ORDER — SERTRALINE HCL 50 MG PO TABS: 50.0000 mg | ORAL_TABLET | Freq: Every day | ORAL | 3 refills | Status: DC

## 2024-06-22 NOTE — Patient Instructions (Addendum)
 Give us  2-3 business days to get the results of your labs back.   Keep the diet clean and stay active.  Take 1/2 tab of your Zoloft  for the 1st 2 weeks and then take 1 full tab daily.   Please consider adding some weight resistance exercise to your routine. Consider yoga as well.   Please get me a copy of your advanced directive form at your convenience.   Call Center for [facility] at [phone] for an appointment.  They are located at [address], [place], [place], [address] (right across the hall from our office).  Please consider counseling. Contact [phone] to schedule an appointment or inquire about cost/insurance coverage.  [facility] located at [address], [address], [place], [place].  Phone number = [phone].  Dr. Verdel Silk - Adult Psychiatry.    [facility] located at [address] [place], [place], [place]. Phone number = [phone].   [facility] located at [address], [place], [place].  Phone number = [phone].   [facility] located at [address] [place], [place], [place].  Phone number = [phone].  Let us  know if you need anything.  Piriformis Syndrome Rehab It is normal to feel mild stretching, pulling, tightness, or discomfort as you do these exercises, but you should stop right away if you feel sudden pain or your pain gets worse.   Stretching and range of motion exercises These exercises warm up your muscles and joints and improve the movement and flexibility of your hip and pelvis. These exercises also help to relieve pain, numbness, and tingling. Exercise A: Hip rotators    Lie on your back on a firm surface. Pull your left / right knee toward your same shoulder with your left / right hand until your knee is pointing toward the ceiling. Hold your left / right ankle with your other hand. Keeping your knee steady, gently pull your  left / right ankle toward your other shoulder until you feel a stretch in your buttocks. Hold this position for 30 seconds. Repeat 2 times. Complete this stretch 3 times per week. Exercise B: Hip extensors Lie on your back on a firm surface. Both of your legs should be straight. Pull your left / right knee to your chest. Hold your leg in this position by holding onto the back of your thigh or the front of your knee. Hold this position for 30 seconds. Slowly return to the starting position. Repeat 2 times. Complete this stretch 3 times per week.  Strengthening exercises These exercises build strength and endurance in your hip and thigh muscles. Endurance is the ability to use your muscles for a long time, even after they get tired. Exercise C: Straight leg raises (hip abductors)     Lie on your side with your left / right leg in the top position. Lie so your head, shoulder, knee, and hip line up. Bend your bottom knee to help you balance. Lift your top leg up 4-6 inches (10-15 cm), keeping your toes pointed straight ahead. Hold this position for 1 second. Slowly lower your leg to the starting position. Let your muscles relax completely. Repeat for a total of 10 repetitions. Repeat 2 times. Complete this exercise 3 times per week. Exercise D: Hip abductors and rotators, quadruped    Get on your hands and knees on a firm, lightly padded surface. Your hands should be directly below your shoulders,  and your knees should be directly below your hips. Lift your left / right knee out to the side. Keep your knee bent. Do not twist your body. Hold this position for 1 seconds. Slowly lower your leg. Repeat for a total of 10 repetitions.  Repeat 1 times. Complete this exercise 3 times per week. Exercise E: Straight leg raises (hip extensors) Lie on your abdomen on a bed or a firm surface with a pillow under your hips. Squeeze your buttock muscles and lift your left / right thigh off the bed. Do not  let your back arch. Hold this position for 3 seconds. Slowly return to the starting position. Let your muscles relax completely before doing another repetition. Repeat 2 times. Complete this exercise 3 times per week.  This information is not intended to replace advice given to you by your health care provider. Make sure you discuss any questions you have with your health care provider. Document Released: 06/30/2005 Document Revised: 03/04/2016 Document Reviewed: 06/12/2015 Elsevier Interactive Patient Education  [employer].
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
## 2024-06-28 ENCOUNTER — Encounter: Admitting: Family Medicine

## 2024-08-03 ENCOUNTER — Ambulatory Visit: Admitting: Family Medicine

## 2024-08-03 ENCOUNTER — Encounter: Payer: Self-pay | Admitting: Family Medicine

## 2024-08-03 VITALS — BP 130/82 | HR 100 | Temp 98.0°F | Resp 16 | Ht 66.0 in | Wt 248.8 lb

## 2024-08-03 DIAGNOSIS — F411 Generalized anxiety disorder: Secondary | ICD-10-CM

## 2024-08-03 DIAGNOSIS — F339 Major depressive disorder, recurrent, unspecified: Secondary | ICD-10-CM | POA: Insufficient documentation

## 2024-08-03 MED ORDER — SERTRALINE HCL 50 MG PO TABS: 50.0000 mg | ORAL_TABLET | Freq: Every day | ORAL | 3 refills | Status: AC

## 2024-08-03 NOTE — Patient Instructions (Signed)
 Let me know if you need anything.  Send me a message if you want to come off of the Zoloft .  Let us  know if you need anything.

## 2024-08-03 NOTE — Progress Notes (Signed)
 Chief Complaint  Patient presents with   Follow-up    Follow Up    Subjective Tina Blanchard presents for f/u anxiety/depression.  Pt is currently being treated with Zoloft  50 mg/d.  Reports doing well (75-80% improvement) since treatment. No thoughts of harming self or others. No self-medication with alcohol, prescription drugs or illicit drugs. Pt is following with a counselor/psychologist.  Past Medical History:  Diagnosis Date   GAD (generalized anxiety disorder)    Allergies as of 08/03/2024   No Known Allergies      Medication List        Accurate as of August 03, 2024  7:51 AM. If you have any questions, ask your nurse or doctor.          amphetamine -dextroamphetamine  10 MG tablet Commonly known as: ADDERALL Take 1 tab by mouth daily at noon.   amphetamine -dextroamphetamine  20 MG 24 hr capsule Commonly known as: Adderall XR Take 1 capsule (20 mg total) by mouth daily.   amphetamine -dextroamphetamine  20 MG 24 hr capsule Commonly known as: Adderall XR Take 1 capsule (20 mg total) by mouth daily.   amphetamine -dextroamphetamine  20 MG 24 hr capsule Commonly known as: Adderall XR Take 1 capsule (20 mg total) by mouth daily. Start taking on: August 21, 2024   sertraline  50 MG tablet Commonly known as: ZOLOFT  Take 1 tablet (50 mg total) by mouth daily.        Exam BP 130/82 (BP Location: Left Arm, Patient Position: Sitting)   Pulse 100   Temp 98 F (36.7 C) (Oral)   Resp 16   Ht 5' 6 (1.676 m)   Wt 248 lb 12.8 oz (112.9 kg)   SpO2 98%   BMI 40.16 kg/m  General:  well developed, well nourished, in no apparent distress Lungs:  No respiratory distress Psych: well oriented with normal range of affect and age-appropriate judgement/insight, alert and oriented x4.  Assessment and Plan  GAD (generalized anxiety disorder)  Depression, recurrent  Chronic, stable.  Continue Zoloft  50 mg daily.  Counseled on exercise. F/u in 5 mo. The  patient voiced understanding and agreement to the plan.  Mabel Mt [place], DO 08/03/24 7:51 AM

## 2025-01-02 ENCOUNTER — Ambulatory Visit: Admitting: Family Medicine
